# Patient Record
Sex: Female | Born: 1945
Health system: Southern US, Community
[De-identification: ages and names within clinical notes are randomized; demographics above are authoritative.]

## PROBLEM LIST (undated history)

## (undated) DIAGNOSIS — H269 Unspecified cataract: Secondary | ICD-10-CM

## (undated) DIAGNOSIS — K219 Gastro-esophageal reflux disease without esophagitis: Secondary | ICD-10-CM

## (undated) HISTORY — DX: Unspecified cataract: H26.9

## (undated) HISTORY — PX: ABDOMINAL HYSTERECTOMY: SHX81

## (undated) HISTORY — DX: Gastro-esophageal reflux disease without esophagitis: K21.9

---

## 2000-01-23 ENCOUNTER — Encounter: Admission: RE | Admit: 2000-01-23 | Discharge: 2000-01-23 | Payer: Self-pay | Admitting: Obstetrics and Gynecology

## 2000-01-23 ENCOUNTER — Encounter: Payer: Self-pay | Admitting: Obstetrics and Gynecology

## 2002-08-04 ENCOUNTER — Encounter: Payer: Self-pay | Admitting: Family Medicine

## 2002-08-04 ENCOUNTER — Encounter: Admission: RE | Admit: 2002-08-04 | Discharge: 2002-08-04 | Payer: Self-pay | Admitting: Family Medicine

## 2004-03-28 ENCOUNTER — Encounter: Admission: RE | Admit: 2004-03-28 | Discharge: 2004-03-28 | Payer: Self-pay | Admitting: Family Medicine

## 2004-09-11 ENCOUNTER — Ambulatory Visit: Payer: Self-pay | Admitting: Family Medicine

## 2005-04-09 ENCOUNTER — Ambulatory Visit: Payer: Self-pay | Admitting: Family Medicine

## 2005-04-15 ENCOUNTER — Encounter: Admission: RE | Admit: 2005-04-15 | Discharge: 2005-04-15 | Payer: Self-pay | Admitting: Family Medicine

## 2005-04-21 ENCOUNTER — Encounter: Admission: RE | Admit: 2005-04-21 | Discharge: 2005-04-21 | Payer: Self-pay | Admitting: Family Medicine

## 2005-05-26 ENCOUNTER — Ambulatory Visit: Payer: Self-pay | Admitting: Internal Medicine

## 2005-06-10 ENCOUNTER — Ambulatory Visit: Payer: Self-pay | Admitting: Internal Medicine

## 2005-06-10 ENCOUNTER — Encounter (INDEPENDENT_AMBULATORY_CARE_PROVIDER_SITE_OTHER): Payer: Self-pay | Admitting: Specialist

## 2005-09-13 ENCOUNTER — Ambulatory Visit: Payer: Self-pay | Admitting: Family Medicine

## 2005-11-03 LAB — HM COLONOSCOPY

## 2006-05-07 ENCOUNTER — Ambulatory Visit: Payer: Self-pay | Admitting: Family Medicine

## 2006-05-12 ENCOUNTER — Ambulatory Visit: Payer: Self-pay | Admitting: Family Medicine

## 2006-05-19 ENCOUNTER — Ambulatory Visit: Payer: Self-pay | Admitting: Family Medicine

## 2006-07-17 ENCOUNTER — Ambulatory Visit: Payer: Self-pay | Admitting: Family Medicine

## 2006-08-19 ENCOUNTER — Ambulatory Visit: Payer: Self-pay | Admitting: Family Medicine

## 2007-06-18 ENCOUNTER — Ambulatory Visit: Payer: Self-pay | Admitting: Family Medicine

## 2007-06-18 LAB — CONVERTED CEMR LAB
Bilirubin Urine: NEGATIVE
Glucose, Urine, Semiquant: NEGATIVE
Nitrite: NEGATIVE
Urobilinogen, UA: 0.2
pH: 6.5

## 2007-06-21 LAB — CONVERTED CEMR LAB
AST: 19 units/L (ref 0–37)
Basophils Relative: 1.3 % — ABNORMAL HIGH (ref 0.0–1.0)
Bilirubin, Direct: 0.1 mg/dL (ref 0.0–0.3)
CO2: 28 meq/L (ref 19–32)
Calcium: 8.7 mg/dL (ref 8.4–10.5)
Cholesterol: 166 mg/dL (ref 0–200)
Eosinophils Absolute: 0.1 10*3/uL (ref 0.0–0.6)
Eosinophils Relative: 2.8 % (ref 0.0–5.0)
GFR calc Af Amer: 82 mL/min
HCT: 38.1 % (ref 36.0–46.0)
LDL Cholesterol: 98 mg/dL (ref 0–99)
MCHC: 34.2 g/dL (ref 30.0–36.0)
MCV: 84.9 fL (ref 78.0–100.0)
Monocytes Relative: 11.4 % — ABNORMAL HIGH (ref 3.0–11.0)
Neutro Abs: 2.7 10*3/uL (ref 1.4–7.7)
Neutrophils Relative %: 55.9 % (ref 43.0–77.0)
Platelets: 226 10*3/uL (ref 150–400)
RDW: 14.1 % (ref 11.5–14.6)

## 2007-06-25 ENCOUNTER — Ambulatory Visit: Payer: Self-pay | Admitting: Family Medicine

## 2007-06-25 DIAGNOSIS — K219 Gastro-esophageal reflux disease without esophagitis: Secondary | ICD-10-CM | POA: Insufficient documentation

## 2007-06-28 ENCOUNTER — Telehealth: Payer: Self-pay | Admitting: Family Medicine

## 2007-07-06 ENCOUNTER — Encounter: Payer: Self-pay | Admitting: Family Medicine

## 2007-08-17 ENCOUNTER — Ambulatory Visit: Payer: Self-pay | Admitting: Family Medicine

## 2007-08-23 ENCOUNTER — Telehealth: Payer: Self-pay | Admitting: Family Medicine

## 2007-08-23 ENCOUNTER — Ambulatory Visit: Payer: Self-pay | Admitting: Family Medicine

## 2007-12-07 ENCOUNTER — Telehealth: Payer: Self-pay | Admitting: Family Medicine

## 2008-04-14 ENCOUNTER — Ambulatory Visit: Payer: Self-pay | Admitting: Family Medicine

## 2008-04-14 DIAGNOSIS — M8580 Other specified disorders of bone density and structure, unspecified site: Secondary | ICD-10-CM | POA: Insufficient documentation

## 2008-06-20 ENCOUNTER — Ambulatory Visit: Payer: Self-pay | Admitting: Family Medicine

## 2008-06-22 LAB — CONVERTED CEMR LAB
ALT: 14 units/L (ref 0–35)
Albumin: 3.6 g/dL (ref 3.5–5.2)
Alkaline Phosphatase: 63 units/L (ref 39–117)
Bilirubin, Direct: 0.1 mg/dL (ref 0.0–0.3)
Creatinine, Ser: 0.9 mg/dL (ref 0.4–1.2)
GFR calc Af Amer: 82 mL/min
Glucose, Bld: 87 mg/dL (ref 70–99)
Sodium: 138 meq/L (ref 135–145)
Total Bilirubin: 1.1 mg/dL (ref 0.3–1.2)
Total CHOL/HDL Ratio: 4
Total Protein: 7.6 g/dL (ref 6.0–8.3)
VLDL: 15 mg/dL (ref 0–40)
Vit D, 1,25-Dihydroxy: 58 (ref 30–89)

## 2008-07-05 ENCOUNTER — Telehealth: Payer: Self-pay | Admitting: Family Medicine

## 2008-07-25 ENCOUNTER — Ambulatory Visit: Payer: Self-pay | Admitting: Family Medicine

## 2008-08-03 ENCOUNTER — Encounter: Admission: RE | Admit: 2008-08-03 | Discharge: 2008-08-03 | Payer: Self-pay | Admitting: Family Medicine

## 2008-08-08 ENCOUNTER — Encounter (INDEPENDENT_AMBULATORY_CARE_PROVIDER_SITE_OTHER): Payer: Self-pay | Admitting: *Deleted

## 2008-08-24 ENCOUNTER — Ambulatory Visit: Payer: Self-pay | Admitting: Family Medicine

## 2008-08-24 DIAGNOSIS — I10 Essential (primary) hypertension: Secondary | ICD-10-CM | POA: Insufficient documentation

## 2008-12-01 ENCOUNTER — Ambulatory Visit: Payer: Self-pay | Admitting: Family Medicine

## 2009-07-26 ENCOUNTER — Ambulatory Visit: Payer: Self-pay | Admitting: Family Medicine

## 2009-07-27 LAB — CONVERTED CEMR LAB
ALT: 16 units/L (ref 0–35)
AST: 20 units/L (ref 0–37)
Albumin: 3.6 g/dL (ref 3.5–5.2)
Alkaline Phosphatase: 59 units/L (ref 39–117)
Calcium: 9.1 mg/dL (ref 8.4–10.5)
Cholesterol: 162 mg/dL (ref 0–200)
Creatinine, Ser: 0.9 mg/dL (ref 0.4–1.2)
GFR calc non Af Amer: 67.1 mL/min (ref >60)
Glucose, Bld: 92 mg/dL (ref 70–99)
Potassium: 4.8 meq/L (ref 3.5–5.1)
Sodium: 143 meq/L (ref 135–145)
Total CHOL/HDL Ratio: 4

## 2009-08-02 ENCOUNTER — Ambulatory Visit: Payer: Self-pay | Admitting: Family Medicine

## 2009-08-06 ENCOUNTER — Ambulatory Visit: Payer: Self-pay | Admitting: Internal Medicine

## 2009-08-06 ENCOUNTER — Encounter: Payer: Self-pay | Admitting: Family Medicine

## 2009-08-07 ENCOUNTER — Encounter: Admission: RE | Admit: 2009-08-07 | Discharge: 2009-08-07 | Payer: Self-pay | Admitting: Family Medicine

## 2009-08-09 DIAGNOSIS — N63 Unspecified lump in unspecified breast: Secondary | ICD-10-CM | POA: Insufficient documentation

## 2009-08-10 ENCOUNTER — Encounter: Payer: Self-pay | Admitting: Family Medicine

## 2009-08-14 ENCOUNTER — Encounter: Admission: RE | Admit: 2009-08-14 | Discharge: 2009-08-14 | Payer: Self-pay | Admitting: Family Medicine

## 2009-08-14 LAB — HM MAMMOGRAPHY

## 2010-07-15 ENCOUNTER — Ambulatory Visit: Payer: Self-pay | Admitting: Internal Medicine

## 2010-07-15 DIAGNOSIS — S93409A Sprain of unspecified ligament of unspecified ankle, initial encounter: Secondary | ICD-10-CM | POA: Insufficient documentation

## 2010-07-15 DIAGNOSIS — IMO0002 Reserved for concepts with insufficient information to code with codable children: Secondary | ICD-10-CM | POA: Insufficient documentation

## 2010-07-29 ENCOUNTER — Ambulatory Visit: Payer: Self-pay | Admitting: Internal Medicine

## 2010-12-01 LAB — CONVERTED CEMR LAB
Basophils Absolute: 0 10*3/uL (ref 0.0–0.1)
Eosinophils Relative: 1.5 % (ref 0.0–5.0)
Hemoglobin: 14 g/dL (ref 12.0–15.0)
MCHC: 34 g/dL (ref 30.0–36.0)
Monocytes Absolute: 0.8 10*3/uL (ref 0.1–1.0)
RBC: 4.59 M/uL (ref 3.87–5.11)
RDW: 13.4 % (ref 11.5–14.6)
WBC: 7 10*3/uL (ref 4.5–10.5)

## 2010-12-05 NOTE — Assessment & Plan Note (Signed)
Summary: fell on ankle 15 min /dlo   Vital Signs:  Patient profile:   65 year old female Height:      62 inches Weight:      183 pounds BMI:     33.59 Temp:     98.1 degrees F oral Pulse rate:   86 / minute Pulse rhythm:   regular BP sitting:   120 / 76  (left arm) Cuff size:   large  Vitals Entered By: Selena Batten Dance CMA Duncan Dull) (July 15, 2010 9:34 AM) CC: Left ankle swollen and bruised from fall 2 days ago Comments Patient has been icing and elevating over the weekend   History of Present Illness: CC: fell on knee and hurt ankle  2d ago in [place] fell onto R knee and possibly twisted L ankle.  Missed step.  No injuries to knee or ankle in past.  Opened skin on knee.  Has been icing ankle.  Hurts lateral ankle L.  Able to bear weight on ankle.  using ibuprofen which does help some.  neosporin on knee.  Current Medications (verified): 1)  Premarin 0.3 Mg  Tabs (Estrogens Conjugated) .... Take 1 Tablet By Mouth Once A Day 2)  Nexium 40 Mg Cpdr (Esomeprazole Magnesium) .Marland Kitchen.. 1 Tab By Mouth Daily 3)  Motrin Ib 200 Mg  Tabs (Ibuprofen) .... As Needed 4)  Multivitamins   Tabs (Multiple Vitamin) .... Take 1 Tablet By Mouth Once A Day 5)  Calcium Carbonate-Vitamin D 600-400 Mg-Unit  Tabs (Calcium Carbonate-Vitamin D) .... Take 1 Tablet By Mouth Once A Day  Allergies (verified): No Known Drug Allergies PMH-FH-SH reviewed for relevance  Review of Systems       per HPI  Physical Exam  General:  Well-developed,well-nourished,in no acute distress; alert,appropriate and cooperative throughout examination Msk:  FROM at knees, no joint tenderness bilaterally  Left ankle with swelling lateral malleolus, 1+ pitting edema to mid calf.  + tender to palpation at AFL, no point tenderness to medial/lateral malleolus.  No pain at base of 5th MT or naviculate.  Pt able to bear weight.  Negative squeeze test.  No significant ligamentous laxity.  R ankle WNL Pulses:  2+ PT and DP  bilaterally Neurologic:  sensation intact Skin:  + abrasion below R patella   Impression & Recommendations:  Problem # 1:  ANKLE SPRAIN, LEFT (ICD-845.00) ATF ligament sprain.  ASO brace, elevate the affected area, apply ICE for 20 minutes every hour while awake for next 2 days, and rest. Start physical therapy as directed and recheck in 10-14 days if no improvement, sooner if worse.  handout on ankle sprains provided, rec exercises discussed.  no need for xray.  Her updated medication list for this problem includes:    Motrin Ib 200 Mg Tabs (Ibuprofen) .Marland Kitchen... As needed  Problem # 2:  ABRASION, KNEE, RIGHT (ICD-916.0) continue abx ointment and changing dressing daily.  expect to heal well, discussed signs of infection to watch for  Complete Medication List: 1)  Premarin 0.3 Mg Tabs (Estrogens conjugated) .... Take 1 tablet by mouth once a day 2)  Nexium 40 Mg Cpdr (Esomeprazole magnesium) .Marland Kitchen.. 1 tab by mouth daily 3)  Motrin Ib 200 Mg Tabs (Ibuprofen) .... As needed 4)  Multivitamins Tabs (Multiple vitamin) .... Take 1 tablet by mouth once a day 5)  Calcium Carbonate-vitamin D 600-400 Mg-unit Tabs (Calcium carbonate-vitamin d) .... Take 1 tablet by mouth once a day  Other Orders: Flu Vaccine 40yrs + (50093) Admin 1st  Vaccine ([id number])  Patient Instructions: 1)  You have an ankle sprain of your left outer ankle.  Treat with ASO brace and anti inflammatory as needed, also continue to ice area as well as elevate leg and rest. 2)  Return in 2 weeks for follow up and to see how you're doing with the exercises provided, sooner if not improving as expected or worsening. 3)  Pleasure to meet you today. Call clinic with questions.  Current Allergies (reviewed today): No known allergies    Immunizations Administered:  Influenza Vaccine # 1:    Vaccine Type: Fluvax 3+    Site: right deltoid    Mfr: GlaxoSmithKline    Dose: 0.5 ml    Route: IM    Given by: Selena Batten Dance CMA (AAMA)    Exp.  Date: 05/03/2011    Lot #: [id number]    VIS given: 05/28/10 version given July 15, 2010.  Flu Vaccine Consent Questions:    Do you have a history of severe allergic reactions to this vaccine? no    Any prior history of allergic reactions to egg and/or gelatin? no    Do you have a sensitivity to the preservative Thimersol? no    Do you have a past history of Guillan-Barre Syndrome? no    Do you currently have an acute febrile illness? no    Have you ever had a severe reaction to latex? no    Vaccine information given and explained to patient? yes    Are you currently pregnant? no   Prevention & Chronic Care Immunizations   Influenza vaccine: Fluvax 3+  (07/15/2010)   Influenza vaccine due: 07/05/2011    Tetanus booster: 04/03/2000: historical   Tetanus booster due: 04/03/2010    Pneumococcal vaccine: Not documented    H. zoster vaccine: Not documented  Colorectal Screening   Hemoccult: Not documented   Hemoccult due: Not Indicated    Colonoscopy: Normal, q 10 years  (11/03/2005)   Colonoscopy due: 11/04/2015  Other Screening   Pap smear: Not documented   Pap smear due: Not Indicated    Mammogram: BI-RADS CATEGORY 2:  Benign finding(s).^MM DIGITAL DIAG LTD L  (08/14/2009)   Mammogram due: 08/14/2010    DXA bone density scan: osteopenia  (07/05/2007)   DXA scan due: 07/2009    Smoking status: never  (04/14/2008)  Lipids   Total Cholesterol: 162  (07/26/2009)   LDL: 109  (07/26/2009)   LDL Direct: Not documented   HDL: 44.90  (07/26/2009)   Triglycerides: 41.0  (07/26/2009)  Hypertension   Last Blood Pressure: 120 / 76  (07/15/2010)   Serum creatinine: 0.9  (07/26/2009)   Serum potassium 4.8  (07/26/2009)  Self-Management Support :    Hypertension self-management support: Not documented

## 2010-12-05 NOTE — Assessment & Plan Note (Signed)
Summary: 2 WEEK FOLLOWUP/[facility]   Vital Signs:  Patient profile:   65 year old female Weight:      182.50 pounds Temp:     98.5 degrees F oral Pulse rate:   76 / minute Pulse rhythm:   regular BP sitting:   110 / 72  (left arm) Cuff size:   large  Vitals Entered By: Selena Batten Dance CMA Duncan Dull) (July 29, 2010 3:13 PM) CC: 2 week follow up   History of Present Illness: CC:   DOI: 07/13/10  Fell in [place], evaluated in clinic 2 days later, thought injured L ankle (ATF ligament sprain).  Treated with ice, NSAIDs, ASO brace.  Not really using ASO brace anymore.  pain completely gone, still with some swelling worse during day while on feet.  Allergies (verified): No Known Drug Allergies  Physical Exam  General:  Well-developed,well-nourished,in no acute distress; alert,appropriate and cooperative throughout examination Msk:  Left ankle with swelling lateral and medial malleolus, improvement in edema but still mild.  No tenderness to palpation.  No significant ligamentous laxity.  R ankle WNL Pulses:  2+ PT and DP bilaterally Neurologic:  sensation intact   Impression & Recommendations:  Problem # 1:  ANKLE SPRAIN, LEFT (ICD-845.00) Assessment Improved improved.  continue strengthening/stretching exercises Her updated medication list for this problem includes:    Motrin Ib 200 Mg Tabs (Ibuprofen) .Marland Kitchen... As needed  Problem # 2:  ABRASION, KNEE, RIGHT (ICD-916.0) Assessment: Improved resolved  Complete Medication List: 1)  Premarin 0.3 Mg Tabs (Estrogens conjugated) .... Take 1 tablet by mouth once a day 2)  Nexium 40 Mg Cpdr (Esomeprazole magnesium) .Marland Kitchen.. 1 tab by mouth daily 3)  Motrin Ib 200 Mg Tabs (Ibuprofen) .... As needed 4)  Multivitamins Tabs (Multiple vitamin) .... Take 1 tablet by mouth once a day 5)  Calcium Carbonate-vitamin D 600-400 Mg-unit Tabs (Calcium carbonate-vitamin d) .... Take 1 tablet by mouth once a day  Patient Instructions: 1)  ASO brace as needed  for swelling. 2)  Continue stretching and strengthening exercises as discussed (alphabet, lifting book with toes) 3)  I'm glad you're feeling better. 4)  Good to see you today.  Call clinic with questions.  Current Allergies (reviewed today): No known allergies

## 2011-02-03 ENCOUNTER — Other Ambulatory Visit: Payer: Self-pay | Admitting: Family Medicine

## 2011-03-21 NOTE — Assessment & Plan Note (Signed)
[facility]                              [facility] OFFICE NOTE   NAME:JOBEMarshia, Tropea                        MRN:          [id number]  DATE:05/19/2006                            DOB:          03-Jul-1946    This is a 65 year old woman here for complete physical examination.  She has  a couple things to discuss.  First off, she would like to have her Premarin  refilled.  A year ago we switched from every other day dosing to every day  dosing with low-dose Premarin.  She is satisfied with this and has good  control of her hot flashes.  Also, last year we discussed problems with acid  reflux.  We attempted to put her on Nexium daily.  Her insurance would not  cover this so we tried generic omeprazole instead.  She says this does not  help at all and has tried to use over-the-counter measures.  Also, about two  weeks ago an itchy red rash came up on the backs of both of her heels.  She  has never had this problem before.  Otherwise, after we saw her a year ago  she had a colonoscopy in August of 2006.  This was normal except for some  mild diverticulosis.  Dr. Juanda Chance recommended a follow-up colonoscopy in 10  years.  The patient does set up her own yearly mammograms and plans to set  up another one for this year some time soon.  For other details of her past  medical history, family history, social history, habits, etc. see last  physical note dated April 09, 2005.   ALLERGIES:  None.   CURRENT MEDICATIONS:  1.  Premarin 0.3 mg once a day.  2.  Prilosec OTC as needed.   OBJECTIVE:  VITAL SIGNS:  Height 5 feet 3 inches, weight 166, blood pressure  126/80, pulse 72 and regular.  GENERAL:  She appears to be doing well.  SKIN:  Remarkable only for a red, scaly rash on both posterior heels.  HEENT:  Eyes are clear.  Sclerae, pharynx clear.  NECK:  Supple without lymphadenopathy, masses.  LUNGS:  Clear.  CARDIAC:  Rate and rhythm regular without gallops,  murmurs, rubs.  Pulses  are full.  EKG is within normal limits.  BREASTS:  Clear.  AXILLAE:  Clear.  ABDOMEN:  Soft.  Normal bowel sounds.  Nontender.  No masses.  PELVIC:  External genitalia are unremarkable.  RECTAL:  No mass or tenderness.  Stool hemoccult-negative.  EXTREMITIES:  No clubbing, cyanosis, edema.  NEUROLOGIC:  Grossly intact.   She was here for fasting laboratories on July 10.  These were all within  normal limits.   ASSESSMENT AND PLAN:  1.  Complete physical.  I reminded her to set up her mammogram some time      soon and encouraged her to get more regular exercise.  2.  Hormone replacement therapy.  I refilled Premarin as above for the      coming year.  3.  Gastroesophageal reflux disease.  Prevacid 30 mg once  a day.  4.  Eczema.  Triamcinolone 0.1% cream b.i.d. as needed.                                   Tera Mater. Clent Ridges, MD   SAF/MedQ  DD:  05/19/2006  DT:  05/19/2006  Job #:  [id number]

## 2011-04-04 ENCOUNTER — Other Ambulatory Visit: Payer: Self-pay | Admitting: Family Medicine

## 2011-06-04 ENCOUNTER — Other Ambulatory Visit: Payer: Self-pay | Admitting: Family Medicine

## 2011-08-10 ENCOUNTER — Other Ambulatory Visit: Payer: Self-pay | Admitting: Family Medicine

## 2011-08-11 NOTE — Telephone Encounter (Signed)
Overdue for CPX.Molli Knock to refill to appt once it is made.

## 2011-08-11 NOTE — Telephone Encounter (Signed)
Left message for patient to return call.

## 2011-08-12 NOTE — Telephone Encounter (Signed)
Left message on machine for patient to call back. Pharmacist notified that patient needs to call the office to schedule a CPX before refills can be approved.

## 2011-08-13 ENCOUNTER — Other Ambulatory Visit: Payer: Self-pay | Admitting: *Deleted

## 2011-08-13 MED ORDER — ESTROGENS CONJUGATED 0.3 MG PO TABS: ORAL_TABLET | ORAL | Status: DC

## 2011-08-13 NOTE — Telephone Encounter (Signed)
Patient schedule appt for cpx on oct 25

## 2011-08-27 ENCOUNTER — Encounter: Payer: Self-pay | Admitting: Family Medicine

## 2011-08-28 ENCOUNTER — Ambulatory Visit: Payer: Medicare Other

## 2011-08-28 ENCOUNTER — Ambulatory Visit (INDEPENDENT_AMBULATORY_CARE_PROVIDER_SITE_OTHER): Payer: Medicare Other | Admitting: Family Medicine

## 2011-08-28 ENCOUNTER — Encounter: Payer: Self-pay | Admitting: Family Medicine

## 2011-08-28 VITALS — BP 148/80 | HR 80 | Temp 98.1°F | Ht 62.0 in | Wt 180.5 lb

## 2011-08-28 DIAGNOSIS — Z23 Encounter for immunization: Secondary | ICD-10-CM

## 2011-08-28 DIAGNOSIS — Z0000 Encounter for general adult medical examination without abnormal findings: Secondary | ICD-10-CM

## 2011-08-28 DIAGNOSIS — K219 Gastro-esophageal reflux disease without esophagitis: Secondary | ICD-10-CM

## 2011-08-28 DIAGNOSIS — Z780 Asymptomatic menopausal state: Secondary | ICD-10-CM

## 2011-08-28 DIAGNOSIS — M899 Disorder of bone, unspecified: Secondary | ICD-10-CM

## 2011-08-28 DIAGNOSIS — Z1231 Encounter for screening mammogram for malignant neoplasm of breast: Secondary | ICD-10-CM

## 2011-08-28 DIAGNOSIS — I10 Essential (primary) hypertension: Secondary | ICD-10-CM

## 2011-08-28 DIAGNOSIS — M949 Disorder of cartilage, unspecified: Secondary | ICD-10-CM

## 2011-08-28 LAB — CBC WITH DIFFERENTIAL/PLATELET
Basophils Absolute: 0 10*3/uL (ref 0.0–0.1)
Basophils Relative: 0.8 % (ref 0.0–3.0)
Eosinophils Absolute: 0.1 10*3/uL (ref 0.0–0.7)
Eosinophils Relative: 1.3 % (ref 0.0–5.0)
Neutro Abs: 3.8 10*3/uL (ref 1.4–7.7)
Neutrophils Relative %: 62.2 % (ref 43.0–77.0)
WBC: 6.1 10*3/uL (ref 4.5–10.5)

## 2011-08-28 LAB — COMPREHENSIVE METABOLIC PANEL
Albumin: 4.1 g/dL (ref 3.5–5.2)
CO2: 27 mEq/L (ref 19–32)
Chloride: 107 mEq/L (ref 96–112)
Creatinine, Ser: 1 mg/dL (ref 0.4–1.2)
Potassium: 4.7 mEq/L (ref 3.5–5.1)
Sodium: 143 mEq/L (ref 135–145)
Total Protein: 7.8 g/dL (ref 6.0–8.3)

## 2011-08-28 LAB — TSH: TSH: 1.21 u[IU]/mL (ref 0.35–5.50)

## 2011-08-28 LAB — LIPID PANEL
Cholesterol: 162 mg/dL (ref 0–200)
HDL: 48.7 mg/dL (ref >39.00)
VLDL: 21.2 mg/dL (ref 0.0–40.0)

## 2011-08-28 MED ORDER — ESOMEPRAZOLE MAGNESIUM 40 MG PO CPDR: 40.0000 mg | DELAYED_RELEASE_CAPSULE | Freq: Every day | ORAL | Status: DC

## 2011-08-28 MED ORDER — ESTROGENS CONJUGATED 0.3 MG PO TABS: 0.3000 mg | ORAL_TABLET | Freq: Every day | ORAL | Status: DC

## 2011-08-28 NOTE — Patient Instructions (Addendum)
Over the next week or 2 .Marland Kitchen Check BPs at home different times of the day.. Call with results. Goal  <140/90. Work on regular exercise, healthy eating, low fat, low carb diet. Speak with Shirlee Limerick on way out after labs about referrals. Look into insurance about shingles vaccine coverage. Try to wean off premarin as able, over a period 1-2 months.

## 2011-08-28 NOTE — Assessment & Plan Note (Signed)
Due for re-eval. 

## 2011-08-28 NOTE — Assessment & Plan Note (Signed)
Borderline control.. Follow at home.. If greater than 140/90 consistently consider HCTZ.  Will check for secondary cause and end organ damage with labs and EKG.

## 2011-08-28 NOTE — Assessment & Plan Note (Signed)
Stable

## 2011-08-28 NOTE — Progress Notes (Signed)
Subjective:    Patient ID: Danielle Kane, female    DOB: [birth year], 65 y.o.   MRN: [id number]  HPI I have personally reviewed the Medicare Annual Wellness questionnaire and have noted 1. The patient's medical and social history 2. Their use of alcohol, tobacco or illicit drugs 3. Their current medications and supplements 4. The patient's functional ability including ADL's, fall risks, home safety risks and hearing or visual             impairment. 5. Diet and physical activities 6. Evidence for depression or mood disorders  The patients weight, height, BMI and visual acuity have been recorded in the chart I have made referrals, counseling and provided education to the patient based review of the above and I have provided the pt with a written personalized care plan for preventive services.  Hypertension:   BP elevated today  on no medication.  (last 2 OV in 07/2010 BP nml in office)  Chest pain with exertion: None Edema: occ after being on feet long day Short of breath:None Average home BPs: not checking recently. Other issues: walking rarely for exercsie. Due for fasting labs   GERD: on nexium 40 mg daily   Review of Systems  Constitutional: Negative for fever, fatigue and unexpected weight change.  HENT: Negative for ear pain, congestion, sore throat, sneezing, trouble swallowing and sinus pressure.   Eyes: Negative for pain and itching.  Respiratory: Negative for cough, shortness of breath and wheezing.   Cardiovascular: Negative for chest pain, palpitations and leg swelling.  Gastrointestinal: Negative for nausea, abdominal pain, diarrhea, constipation and blood in stool.  Genitourinary: Negative for dysuria, hematuria, vaginal discharge, difficulty urinating and menstrual problem.  Skin: Negative for rash.  Neurological: Negative for syncope, weakness, light-headedness, numbness and headaches.  Psychiatric/Behavioral: Negative for confusion and dysphoric mood. The patient  is not nervous/anxious.        Objective:   Physical Exam  Constitutional: Vital signs are normal. She appears well-developed and well-nourished. She is cooperative.  Non-toxic appearance. She does not appear ill. No distress.       overweight  HENT:  Head: Normocephalic.  Right Ear: Hearing, tympanic membrane, external ear and ear canal normal.  Left Ear: Hearing, tympanic membrane, external ear and ear canal normal.  Nose: Nose normal.  Eyes: Conjunctivae, EOM and lids are normal. Pupils are equal, round, and reactive to light. No foreign bodies found.  Neck: Trachea normal and normal range of motion. Neck supple. Carotid bruit is not present. No mass and no thyromegaly present.  Cardiovascular: Normal rate, regular rhythm, S1 normal, S2 normal, normal heart sounds and intact distal pulses.  Exam reveals no gallop.   No murmur heard. Pulmonary/Chest: Effort normal and breath sounds normal. No respiratory distress. She has no wheezes. She has no rhonchi. She has no rales.  Abdominal: Soft. Normal appearance and bowel sounds are normal. She exhibits no distension, no fluid wave, no abdominal bruit and no mass. There is no hepatosplenomegaly. There is no tenderness. There is no rebound, no guarding and no CVA tenderness. No hernia.  Genitourinary: No breast swelling, tenderness, discharge or bleeding. Pelvic exam was performed with patient prone.  Lymphadenopathy:    She has no cervical adenopathy.    She has no axillary adenopathy.  Neurological: She is alert. She has normal strength. No cranial nerve deficit or sensory deficit.  Skin: Skin is warm, dry and intact. No rash noted.  Psychiatric: Her speech is normal and behavior is  normal. Judgment normal. Her mood appears not anxious. Cognition and memory are normal. She does not exhibit a depressed mood.          Assessment & Plan:   Welcome to Medicare: The patient's preventative maintenance and recommended screening tests for an  annual wellness exam were reviewed in full today. Brought up to date unless services declined.  Counselled on the importance of diet, exercise, and its role in overall health and mortality. The patient's FH and SH was reviewed, including their home life, tobacco status, and drug and alcohol status.   EKG today performed:  Last DEXA 2010, due for repeat, osteopenia. Mammogram due, will schedule. Colonoscopy 2007, rpt in 10 years. Vaccines: flu given, due for zostavax, PNA ....  Given PNA today. She will look into shingles coverage. Will get tdap at pharmacy given not covered by Medicare.  DVE/pap: not indicated.. S/p hysterectomy

## 2011-09-04 ENCOUNTER — Encounter: Payer: Self-pay | Admitting: *Deleted

## 2011-09-05 ENCOUNTER — Other Ambulatory Visit: Payer: Medicare Other

## 2011-09-19 ENCOUNTER — Ambulatory Visit
Admission: RE | Admit: 2011-09-19 | Discharge: 2011-09-19 | Disposition: A | Discharge status: Home / Self Care | Payer: Medicare Other | Source: Ambulatory Visit | Attending: Family Medicine | Admitting: Family Medicine

## 2011-09-19 DIAGNOSIS — Z780 Asymptomatic menopausal state: Secondary | ICD-10-CM

## 2011-09-19 DIAGNOSIS — Z1231 Encounter for screening mammogram for malignant neoplasm of breast: Secondary | ICD-10-CM

## 2012-08-26 ENCOUNTER — Ambulatory Visit (INDEPENDENT_AMBULATORY_CARE_PROVIDER_SITE_OTHER): Payer: Medicare Other

## 2012-08-26 DIAGNOSIS — Z23 Encounter for immunization: Secondary | ICD-10-CM

## 2012-09-03 ENCOUNTER — Other Ambulatory Visit: Payer: Self-pay | Admitting: Family Medicine

## 2012-10-06 ENCOUNTER — Other Ambulatory Visit: Payer: Self-pay | Admitting: Family Medicine

## 2012-12-07 ENCOUNTER — Telehealth: Payer: Self-pay

## 2012-12-07 NOTE — Telephone Encounter (Signed)
[facility] faxed prior auth premarin; spoke with Trula Ore [phone] and approved over phone until 11/02/13. [facility] notified and rx went thru.

## 2012-12-12 ENCOUNTER — Telehealth: Payer: Self-pay | Admitting: Family Medicine

## 2012-12-12 DIAGNOSIS — M949 Disorder of cartilage, unspecified: Secondary | ICD-10-CM

## 2012-12-12 DIAGNOSIS — M899 Disorder of bone, unspecified: Secondary | ICD-10-CM

## 2012-12-12 DIAGNOSIS — I10 Essential (primary) hypertension: Secondary | ICD-10-CM

## 2012-12-12 NOTE — Telephone Encounter (Signed)
Message copied by Excell Seltzer on Sun Dec 12, 2012 10:50 PM ------      Message from: [name]      Created: Mon Dec 06, 2012  4:06 PM      Regarding: Lab orders for Tuesday, 2.11.14       Patient is scheduled for CPX labs, please order future labs, Thanks , Danielle Kane       ------

## 2012-12-14 ENCOUNTER — Other Ambulatory Visit (INDEPENDENT_AMBULATORY_CARE_PROVIDER_SITE_OTHER): Payer: Medicare Other

## 2012-12-14 DIAGNOSIS — M949 Disorder of cartilage, unspecified: Secondary | ICD-10-CM

## 2012-12-14 DIAGNOSIS — I10 Essential (primary) hypertension: Secondary | ICD-10-CM

## 2012-12-14 DIAGNOSIS — M899 Disorder of bone, unspecified: Secondary | ICD-10-CM

## 2012-12-14 LAB — COMPREHENSIVE METABOLIC PANEL
ALT: 16 U/L (ref 0–35)
Albumin: 3.7 g/dL (ref 3.5–5.2)
Alkaline Phosphatase: 54 U/L (ref 39–117)
CO2: 27 mEq/L (ref 19–32)
Glucose, Bld: 87 mg/dL (ref 70–99)
Potassium: 4.2 mEq/L (ref 3.5–5.1)
Sodium: 139 mEq/L (ref 135–145)
Total Bilirubin: 0.9 mg/dL (ref 0.3–1.2)
Total Protein: 7.3 g/dL (ref 6.0–8.3)

## 2012-12-14 LAB — LIPID PANEL
HDL: 43.1 mg/dL (ref >39.00)
Total CHOL/HDL Ratio: 4
VLDL: 16.8 mg/dL (ref 0.0–40.0)

## 2012-12-15 LAB — VITAMIN D 25 HYDROXY (VIT D DEFICIENCY, FRACTURES): Vit D, 25-Hydroxy: 38 ng/mL (ref 30–89)

## 2012-12-21 ENCOUNTER — Ambulatory Visit (INDEPENDENT_AMBULATORY_CARE_PROVIDER_SITE_OTHER): Payer: Medicare Other | Admitting: Family Medicine

## 2012-12-21 ENCOUNTER — Encounter: Payer: Self-pay | Admitting: Family Medicine

## 2012-12-21 VITALS — BP 120/70 | HR 80 | Temp 98.1°F | Ht 62.0 in | Wt 187.5 lb

## 2012-12-21 DIAGNOSIS — M899 Disorder of bone, unspecified: Secondary | ICD-10-CM

## 2012-12-21 DIAGNOSIS — Z0000 Encounter for general adult medical examination without abnormal findings: Secondary | ICD-10-CM

## 2012-12-21 DIAGNOSIS — I10 Essential (primary) hypertension: Secondary | ICD-10-CM

## 2012-12-21 MED ORDER — ESTROGENS CONJUGATED 0.3 MG PO TABS: 0.3000 mg | ORAL_TABLET | ORAL | Status: DC

## 2012-12-21 MED ORDER — ESOMEPRAZOLE MAGNESIUM 40 MG PO CPDR: DELAYED_RELEASE_CAPSULE | ORAL | Status: DC

## 2012-12-21 NOTE — Assessment & Plan Note (Signed)
Stble last check... Repeat DEXA in 2016

## 2012-12-21 NOTE — Patient Instructions (Addendum)
Increase exercise, continue healthy eating, work on weight loss.  Schedule mammogram on your own.

## 2012-12-21 NOTE — Assessment & Plan Note (Signed)
Well controlled on no medication  

## 2012-12-21 NOTE — Progress Notes (Signed)
HPI  I have personally reviewed the Medicare Annual Wellness questionnaire and have noted  1. The patient's medical and social history  2. Their use of alcohol, tobacco or illicit drugs  3. Their current medications and supplements  4. The patient's functional ability including ADL's, fall risks, home safety risks and hearing or visual  impairment.  5. Diet and physical activities  6. Evidence for depression or mood disorders  The patients weight, height, BMI and visual acuity have been recorded in the chart  I have made referrals, counseling and provided education to the patient based review of the above and I have provided the pt with a written personalized care plan for preventive services.    Occ Bilateral pain in CMC joints and pain radiates up arms. No numbness and tingling.  Ibuprofen does not help.  Types a lot at work.  Hypertension: BP good today on no medication.  Chest pain with exertion: None  Edema: occ after being on feet long day  Short of breath:None  Average home BPs: not checking  Other issues: walking rarely for exercise, diet good control.  Lab Results  Component Value Date   CHOL 151 12/14/2012   HDL 43.10 12/14/2012   LDLCALC 91 12/14/2012   TRIG 84.0 12/14/2012   CHOLHDL 4 12/14/2012     GERD: on nexium 40 mg daily   Review of Systems  Constitutional: Negative for fever, fatigue and unexpected weight change.  HENT: Negative for ear pain, congestion, sore throat, sneezing, trouble swallowing and sinus pressure.  Eyes: Negative for pain and itching.  Respiratory: Negative for cough, shortness of breath and wheezing.  Cardiovascular: Negative for chest pain, palpitations and leg swelling.  Gastrointestinal: Negative for nausea, abdominal pain, diarrhea, constipation and blood in stool.  Genitourinary: Negative for dysuria, hematuria, vaginal discharge, difficulty urinating and menstrual problem.  Skin: Negative for rash.  Neurological: Negative for  syncope, weakness, light-headedness, numbness and headaches.  Psychiatric/Behavioral: Negative for confusion and dysphoric mood. The patient is not nervous/anxious.  Objective:   Physical Exam  Constitutional: Vital signs are normal. She appears well-developed and well-nourished. She is cooperative. Non-toxic appearance. She does not appear ill. No distress.  overweight  HENT:  Head: Normocephalic.  Right Ear: Hearing, tympanic membrane, external ear and ear canal normal.  Left Ear: Hearing, tympanic membrane, external ear and ear canal normal.  Nose: Nose normal.  Eyes: Conjunctivae, EOM and lids are normal. Pupils are equal, round, and reactive to light. No foreign bodies found.  Neck: Trachea normal and normal range of motion. Neck supple. Carotid bruit is not present. No mass and no thyromegaly present.  Cardiovascular: Normal rate, regular rhythm, S1 normal, S2 normal, normal heart sounds and intact distal pulses. Exam reveals no gallop.  No murmur heard.  Pulmonary/Chest: Effort normal and breath sounds normal. No respiratory distress. She has no wheezes. She has no rhonchi. She has no rales.  Abdominal: Soft. Normal appearance and bowel sounds are normal. She exhibits no distension, no fluid wave, no abdominal bruit and no mass. There is no hepatosplenomegaly. There is no tenderness. There is no rebound, no guarding and no CVA tenderness. No hernia.  Genitourinary: No breast swelling, tenderness, discharge or bleeding. Pelvic exam was performed with patient prone.  Lymphadenopathy:  She has no cervical adenopathy.  She has no axillary adenopathy.  Neurological: She is alert. She has normal strength. No cranial nerve deficit or sensory deficit.  Skin: Skin is warm, dry and intact. No rash noted.  Psychiatric: Her speech is normal and behavior is normal. Judgment normal. Her mood appears not anxious. Cognition and memory are normal. She does not exhibit a depressed mood.  Assessment &  Plan:   Welcome to Medicare:  The patient's preventative maintenance and recommended screening tests for an annual wellness exam were reviewed in full today.  Brought up to date unless services declined.  Counselled on the importance of diet, exercise, and its role in overall health and mortality.  The patient's FH and SH was reviewed, including their home life, tobacco status, and drug and alcohol status.  Last DEXA 2012, due for repeat in 5 years, osteopenia.  Mammogram due, will schedule on her own.  Colonoscopy 2007, rpt in 10 years, 2017 Vaccines: Uptodate with Tdap, flu, PNA , Zoster DVE/pap: Not indicated.. S/p hysterectomy.

## 2013-02-04 ENCOUNTER — Other Ambulatory Visit: Payer: Self-pay | Admitting: Family Medicine

## 2013-07-13 ENCOUNTER — Ambulatory Visit (INDEPENDENT_AMBULATORY_CARE_PROVIDER_SITE_OTHER): Payer: Medicare Other | Admitting: Internal Medicine

## 2013-07-13 ENCOUNTER — Encounter: Payer: Self-pay | Admitting: Internal Medicine

## 2013-07-13 VITALS — BP 130/70 | HR 78 | Temp 97.7°F | Wt 189.0 lb

## 2013-07-13 DIAGNOSIS — B029 Zoster without complications: Secondary | ICD-10-CM

## 2013-07-13 DIAGNOSIS — H109 Unspecified conjunctivitis: Secondary | ICD-10-CM

## 2013-07-13 MED ORDER — VALACYCLOVIR HCL 1 G PO TABS: 1000.0000 mg | ORAL_TABLET | Freq: Three times a day (TID) | ORAL | Status: DC

## 2013-07-13 MED ORDER — POLYMYXIN B-TRIMETHOPRIM 10000-0.1 UNIT/ML-% OP SOLN: 1.0000 [drp] | OPHTHALMIC | Status: DC

## 2013-07-13 NOTE — Progress Notes (Signed)
Subjective:    Patient ID: Danielle Kane, female    DOB: Aug 19, 1946, 67 y.o.   MRN: [id number]  HPI  Pt presents to the clinic today with c/o swelling underneath the left eye. This started on Monday. It started off with little bumps under her eye. Then she noticed the bottom eyelid starting to swell up. It has progressed to eye irritation and drainage. She denies fever, chills, difficulty with vision or body aches. She has not had any injury to the eye or face that she is aware of. She has never had a rash like this before.   Review of Systems      Past Medical History  Diagnosis Date  . GERD (gastroesophageal reflux disease)     Current Outpatient Prescriptions  Medication Sig Dispense Refill  . Calcium Carbonate-Vitamin D (CALCIUM 600-D) 600-400 MG-UNIT per tablet Take 1 tablet by mouth daily.        Marland Kitchen esomeprazole (NEXIUM) 40 MG capsule TAKE 1 CAPSULE BY MOUTH ONCE A DAY BEFORE BREAKFAST  30 capsule  11  . estrogens, conjugated, (PREMARIN) 0.3 MG tablet Take 1 tablet (0.3 mg total) by mouth every other day. Take daily for 21 days then do not take for 7 days.  30 tablet  11  . ibuprofen (ADVIL,MOTRIN) 200 MG tablet Take 200 mg by mouth every 6 (six) hours as needed.        . Multiple Vitamin (MULTIVITAMIN) tablet Take 1 tablet by mouth daily.         No current facility-administered medications for this visit.    No Known Allergies  Family History  Problem Relation Age of Onset  . Heart failure Mother   . Stroke Father   . Kidney disease Maternal Grandmother   . Coronary artery disease Maternal Grandfather   . Heart attack Maternal Grandfather   . Osteoporosis Paternal Grandfather     History   Social History  . Marital Status: Married    Spouse Name: N/A    Number of Children: N/A  . Years of Education: N/A   Occupational History  . manager    Social History Main Topics  . Smoking status: Never Smoker   . Smokeless tobacco: Not on file  . Alcohol Use: No  .  Drug Use: No  . Sexual Activity: Not on file   Other Topics Concern  . Not on file   Social History Narrative   Regular exercise-no   Diet fruits and veggies, water    Has living will, full code     Constitutional: Denies fever, malaise, fatigue, headache or abrupt weight changes.  HEENT: Pt reports eye redness. Denies eye pain, ear pain, ringing in the ears, wax buildup, runny nose, nasal congestion, bloody nose, or sore throat. Skin: Pt reports rash under left eye.  Neurological: Denies dizziness, difficulty with memory, difficulty with speech or problems with balance and coordination.   No other specific complaints in a complete review of systems (except as listed in HPI above).  Objective:   Physical Exam   BP 130/70  Pulse 78  Temp(Src) 97.7 F (36.5 C) (Tympanic)  Wt 189 lb (85.73 kg)  BMI 34.56 kg/m2  SpO2 99% Wt Readings from Last 3 Encounters:  07/13/13 189 lb (85.73 kg)  12/21/12 187 lb 8 oz (85.049 kg)  08/28/11 180 lb 8 oz (81.874 kg)    General: Appears her stated age, well developed, well nourished in NAD. Skin: Warm, dry and intact. Small vesicular lesions on  erythematous base, grouped. HEENT: Head: normal shape and size; Eyes: sclera injected, thick mucous noted at outer canthus, no icterus, conjunctiva pink, PERRLA and EOMs intact; Ears: Tm's gray and intact, normal light reflex; Nose: mucosa pink and moist, septum midline; Throat/Mouth: Teeth present, mucosa pink and moist, no exudate, lesions or ulcerations noted.  Cardiovascular: Normal rate and rhythm. S1,S2 noted.  No murmur, rubs or gallops noted. No JVD or BLE edema. No carotid bruits noted. Pulmonary/Chest: Normal effort and positive vesicular breath sounds. No respiratory distress. No wheezes, rales or ronchi noted.   BMET    Component Value Date/Time   NA 139 12/14/2012 0805   K 4.2 12/14/2012 0805   CL 107 12/14/2012 0805   CO2 27 12/14/2012 0805   GLUCOSE 87 12/14/2012 0805   BUN 17 12/14/2012  0805   CREATININE 0.9 12/14/2012 0805   CALCIUM 9.0 12/14/2012 0805   GFRNONAA 67.10 07/26/2009 0859   GFRAA 82 06/20/2008 1036    Lipid Panel     Component Value Date/Time   CHOL 151 12/14/2012 0805   TRIG 84.0 12/14/2012 0805   HDL 43.10 12/14/2012 0805   CHOLHDL 4 12/14/2012 0805   VLDL 16.8 12/14/2012 0805   LDLCALC 91 12/14/2012 0805    CBC    Component Value Date/Time   WBC 6.1 08/28/2011 1006   RBC 4.74 08/28/2011 1006   HGB 14.6 08/28/2011 1006   HCT 43.0 08/28/2011 1006   PLT 233.0 08/28/2011 1006   MCV 90.8 08/28/2011 1006   MCHC 33.8 08/28/2011 1006   RDW 13.8 08/28/2011 1006   LYMPHSABS 1.6 08/28/2011 1006   MONOABS 0.6 08/28/2011 1006   EOSABS 0.1 08/28/2011 1006   BASOSABS 0.0 08/28/2011 1006    Hgb A1C No results found for this basename: HGBA1C        Assessment & Plan:   Shingles underneath left eye, new onset:  eRx for Valtrex Pt has already had her shingles shot Make an eye appt ASAP  Conjunctivitis of left eye, new onset:  eRx for polytrim eye drops, every 4 hours as needed Warm compresses to eyes for comfort  RTC as needed or if symptoms persist or worsen

## 2013-07-13 NOTE — Patient Instructions (Signed)
Shingles Shingles (herpes zoster) is an infection that is caused by the same virus that causes chickenpox (varicella). The infection causes a painful skin rash and fluid-filled blisters, which eventually break open, crust over, and heal. It may occur in any area of the body, but it usually affects only one side of the body or face. The pain of shingles usually lasts about 1 month. However, some people with shingles may develop long-term (chronic) pain in the affected area of the body. Shingles often occurs many years after the person had chickenpox. It is more common:  In people older than 50 years.  In people with weakened immune systems, such as those with HIV, AIDS, or cancer.  In people taking medicines that weaken the immune system, such as transplant medicines.  In people under great stress. CAUSES  Shingles is caused by the varicella zoster virus (VZV), which also causes chickenpox. After a person is infected with the virus, it can remain in the person's body for years in an inactive state (dormant). To cause shingles, the virus reactivates and breaks out as an infection in a nerve root. The virus can be spread from person to person (contagious) through contact with open blisters of the shingles rash. It will only spread to people who have not had chickenpox. When these people are exposed to the virus, they may develop chickenpox. They will not develop shingles. Once the blisters scab over, the person is no longer contagious and cannot spread the virus to others. SYMPTOMS  Shingles shows up in stages. The initial symptoms may be pain, itching, and tingling in an area of the skin. This pain is usually described as burning, stabbing, or throbbing.In a few days or weeks, a painful red rash will appear in the area where the pain, itching, and tingling were felt. The rash is usually on one side of the body in a band or belt-like pattern. Then, the rash usually turns into fluid-filled blisters. They  will scab over and dry up in approximately 2 3 weeks. Flu-like symptoms may also occur with the initial symptoms, the rash, or the blisters. These may include:  Fever.  Chills.  Headache.  Upset stomach. DIAGNOSIS  Your caregiver will perform a skin exam to diagnose shingles. Skin scrapings or fluid samples may also be taken from the blisters. This sample will be examined under a microscope or sent to a lab for further testing. TREATMENT  There is no specific cure for shingles. Your caregiver will likely prescribe medicines to help you manage the pain, recover faster, and avoid long-term problems. This may include antiviral drugs, anti-inflammatory drugs, and pain medicines. HOME CARE INSTRUCTIONS   Take a cool bath or apply cool compresses to the area of the rash or blisters as directed. This may help with the pain and itching.   Only take over-the-counter or prescription medicines as directed by your caregiver.   Rest as directed by your caregiver.  Keep your rash and blisters clean with mild soap and cool water or as directed by your caregiver.  Do not pick your blisters or scratch your rash. Apply an anti-itch cream or numbing creams to the affected area as directed by your caregiver.  Keep your shingles rash covered with a loose bandage (dressing).  Avoid skin contact with:  Babies.   Pregnant women.   Children with eczema.   Elderly people with transplants.   People with chronic illnesses, such as leukemia or AIDS.   Wear loose-fitting clothing to help ease   the pain of material rubbing against the rash.  Keep all follow-up appointments with your caregiver.If the area involved is on your face, you may receive a referral for follow-up to a specialist, such as an eye doctor (ophthalmologist) or an ear, nose, and throat (ENT) doctor. Keeping all follow-up appointments will help you avoid eye complications, chronic pain, or disability.  SEEK IMMEDIATE MEDICAL  CARE IF:   You have facial pain, pain around the eye area, or loss of feeling on one side of your face.  You have ear pain or ringing in your ear.  You have loss of taste.  Your pain is not relieved with prescribed medicines.   Your redness or swelling spreads.   You have more pain and swelling.  Your condition is worsening or has changed.   You have a feveror persistent symptoms for more than 2 3 days.  You have a fever and your symptoms suddenly get worse. MAKE SURE YOU:  Understand these instructions.  Will watch your condition.  Will get help right away if you are not doing well or get worse. Document Released: 10/20/2005 Document Revised: 07/14/2012 Document Reviewed: 06/03/2012 [employer] Patient Information 2014 [employer].  

## 2013-08-30 ENCOUNTER — Ambulatory Visit (INDEPENDENT_AMBULATORY_CARE_PROVIDER_SITE_OTHER): Payer: Medicare Other

## 2013-08-30 DIAGNOSIS — Z23 Encounter for immunization: Secondary | ICD-10-CM

## 2013-12-20 ENCOUNTER — Other Ambulatory Visit: Payer: Medicare Other

## 2013-12-21 ENCOUNTER — Telehealth: Payer: Self-pay | Admitting: Family Medicine

## 2013-12-21 DIAGNOSIS — M949 Disorder of cartilage, unspecified: Secondary | ICD-10-CM

## 2013-12-21 DIAGNOSIS — I10 Essential (primary) hypertension: Secondary | ICD-10-CM

## 2013-12-21 DIAGNOSIS — M899 Disorder of bone, unspecified: Secondary | ICD-10-CM

## 2013-12-21 NOTE — Telephone Encounter (Signed)
Error.. Labs were from 12/14/2012  orders placed.

## 2013-12-21 NOTE — Telephone Encounter (Signed)
Just had labs  On 2/11 .Marland Kitchen No additional labs needed.

## 2013-12-21 NOTE — Telephone Encounter (Signed)
Message copied by Jinny Sanders on Wed Dec 21, 2013  9:28 AM ------      Message from: Ellamae Sia      Created: Thu Dec 15, 2013 11:54 AM      Regarding: lab orders for Tuesday, 2.17.15       Patient is scheduled for CPX labs, please order future labs, Thanks , Terri       ------

## 2013-12-23 ENCOUNTER — Other Ambulatory Visit: Payer: Medicare Other

## 2013-12-27 ENCOUNTER — Encounter: Payer: Medicare Other | Admitting: Family Medicine

## 2014-01-24 ENCOUNTER — Other Ambulatory Visit: Payer: Self-pay | Admitting: Family Medicine

## 2014-02-09 ENCOUNTER — Other Ambulatory Visit (INDEPENDENT_AMBULATORY_CARE_PROVIDER_SITE_OTHER): Payer: Medicare HMO

## 2014-02-09 DIAGNOSIS — M949 Disorder of cartilage, unspecified: Secondary | ICD-10-CM

## 2014-02-09 DIAGNOSIS — M899 Disorder of bone, unspecified: Secondary | ICD-10-CM

## 2014-02-09 DIAGNOSIS — I10 Essential (primary) hypertension: Secondary | ICD-10-CM

## 2014-02-09 LAB — COMPREHENSIVE METABOLIC PANEL
ALBUMIN: 3.8 g/dL (ref 3.5–5.2)
ALT: 18 U/L (ref 0–35)
AST: 16 U/L (ref 0–37)
Alkaline Phosphatase: 49 U/L (ref 39–117)
BUN: 15 mg/dL (ref 6–23)
CALCIUM: 9.4 mg/dL (ref 8.4–10.5)
CHLORIDE: 106 meq/L (ref 96–112)
CO2: 26 mEq/L (ref 19–32)
Creatinine, Ser: 0.9 mg/dL (ref 0.4–1.2)
GFR: 70.68 mL/min (ref >60.00)
Glucose, Bld: 93 mg/dL (ref 70–99)
POTASSIUM: 4.2 meq/L (ref 3.5–5.1)
Sodium: 140 mEq/L (ref 135–145)
TOTAL PROTEIN: 7.3 g/dL (ref 6.0–8.3)
Total Bilirubin: 0.9 mg/dL (ref 0.3–1.2)

## 2014-02-09 LAB — LIPID PANEL
CHOL/HDL RATIO: 4
Cholesterol: 157 mg/dL (ref 0–200)
HDL: 44.6 mg/dL (ref >39.00)
LDL Cholesterol: 97 mg/dL (ref 0–99)
Triglycerides: 79 mg/dL (ref 0.0–149.0)
VLDL: 15.8 mg/dL (ref 0.0–40.0)

## 2014-02-10 LAB — VITAMIN D 25 HYDROXY (VIT D DEFICIENCY, FRACTURES): VIT D 25 HYDROXY: 43 ng/mL (ref 30–89)

## 2014-02-14 ENCOUNTER — Encounter: Payer: Self-pay | Admitting: Family Medicine

## 2014-02-14 ENCOUNTER — Ambulatory Visit (INDEPENDENT_AMBULATORY_CARE_PROVIDER_SITE_OTHER): Payer: Medicare HMO | Admitting: Family Medicine

## 2014-02-14 VITALS — BP 136/88 | HR 94 | Temp 98.3°F | Ht 62.0 in | Wt 189.2 lb

## 2014-02-14 DIAGNOSIS — I10 Essential (primary) hypertension: Secondary | ICD-10-CM

## 2014-02-14 DIAGNOSIS — Z23 Encounter for immunization: Secondary | ICD-10-CM

## 2014-02-14 DIAGNOSIS — Z0000 Encounter for general adult medical examination without abnormal findings: Secondary | ICD-10-CM

## 2014-02-14 NOTE — Assessment & Plan Note (Signed)
Well controlled. Continue current medication.  

## 2014-02-14 NOTE — Patient Instructions (Signed)
Increase exercise as able.Marland Kitchen 3-5 times a week 20 min at a time at least.  Schedule mammogram on your own.

## 2014-02-14 NOTE — Progress Notes (Signed)
Pre visit review using our clinic review tool, if applicable. No additional management support is needed unless otherwise documented below in the visit note. 

## 2014-02-14 NOTE — Progress Notes (Signed)
HPI  I have personally reviewed the Medicare Annual Wellness questionnaire and have noted  1. The patient's medical and social history  2. Their use of alcohol, tobacco or illicit drugs  3. Their current medications and supplements  4. The patient's functional ability including ADL's, fall risks, home safety risks and hearing or visual  impairment.  5. Diet and physical activities  6. Evidence for depression or mood disorders  The patients weight, height, BMI and visual acuity have been recorded in the chart  I have made referrals, counseling and provided education to the patient based review of the above and I have provided the pt with a written personalized care plan for preventive services.    Doing well overall.  Using nasocort for allergies.  Hypertension: BP well controlled on no medication.    BP Readings from Last 3 Encounters:  02/14/14 136/88  07/13/13 130/70  12/21/12 120/70  Chest pain with exertion: None  Edema: none Short of breath:None  Average home BPs: 130/70 Other issues: walking rarely for exercise, diet good control.  Wt Readings from Last 3 Encounters:  02/14/14 189 lb 4 oz (85.843 kg)  07/13/13 189 lb (85.73 kg)  12/21/12 187 lb 8 oz (85.049 kg)     At goal < 130 on no medication. Lab Results  Component Value Date   CHOL 157 02/09/2014   HDL 44.60 02/09/2014   LDLCALC 97 02/09/2014   TRIG 79.0 02/09/2014   CHOLHDL 4 02/09/2014    GERD: well controlled  on nexium 20 mg daily    She has been able to wean off premarin.   Review of Systems  Constitutional: Negative for fever, fatigue and unexpected weight change.  HENT: Negative for ear pain, congestion, sore throat, sneezing, trouble swallowing and sinus pressure.  Eyes: Negative for pain and itching.  Respiratory: Negative for cough, shortness of breath and wheezing.  Cardiovascular: Negative for chest pain, palpitations and leg swelling.  Gastrointestinal: Negative for nausea, abdominal pain, diarrhea,  constipation and blood in stool.  Genitourinary: Negative for dysuria, hematuria, vaginal discharge, difficulty urinating and menstrual problem.  Skin: Negative for rash.  Neurological: Negative for syncope, weakness, light-headedness, numbness and headaches.  Psychiatric/Behavioral: Negative for confusion and dysphoric mood. The patient is not nervous/anxious.  Objective:   Physical Exam  Constitutional: Vital signs are normal. She appears well-developed and well-nourished. She is cooperative. Non-toxic appearance. She does not appear ill. No distress.  overweight  HENT:  Head: Normocephalic.  Right Ear: Hearing, tympanic membrane, external ear and ear canal normal.  Left Ear: Hearing, tympanic membrane, external ear and ear canal normal.  Nose: Nose normal.  Eyes: Conjunctivae, EOM and lids are normal. Pupils are equal, round, and reactive to light. No foreign bodies found.  Neck: Trachea normal and normal range of motion. Neck supple. Carotid bruit is not present. No mass and no thyromegaly present.  Cardiovascular: Normal rate, regular rhythm, S1 normal, S2 normal, normal heart sounds and intact distal pulses. Exam reveals no gallop.  No murmur heard.  Pulmonary/Chest: Effort normal and breath sounds normal. No respiratory distress. She has no wheezes. She has no rhonchi. She has no rales.  Abdominal: Soft. Normal appearance and bowel sounds are normal. She exhibits no distension, no fluid wave, no abdominal bruit and no mass. There is no hepatosplenomegaly. There is no tenderness. There is no rebound, no guarding and no CVA tenderness. No hernia.  Genitourinary: No breast swelling, tenderness, discharge or bleeding. Pelvic exam was performed with patient  prone.  Lymphadenopathy:  She has no cervical adenopathy.  She has no axillary adenopathy.  Neurological: She is alert. She has normal strength. No cranial nerve deficit or sensory deficit.  Skin: Skin is warm, dry and intact. No rash  noted.  Psychiatric: Her speech is normal and behavior is normal. Judgment normal. Her mood appears not anxious. Cognition and memory are normal. She does not exhibit a depressed mood.  Assessment & Plan:   medicare wellness. The patient's preventative maintenance and recommended screening tests for an annual wellness exam were reviewed in full today.  Brought up to date unless services declined.  Counselled on the importance of diet, exercise, and its role in overall health and mortality.  The patient's FH and SH was reviewed, including their home life, tobacco status, and drug and alcohol status.   Last DEXA 2012, due for repeat in 5 years, osteopenia.  Mammogram due, will schedule on her own.  Colonoscopy 2007, rpt in 10 years, 2017  Vaccines: Uptodate with Tdap, flu, PNA , Zoster, given prevnar today DVE/pap: Not indicated.. S/p  TAH hysterectomy.

## 2014-05-04 ENCOUNTER — Ambulatory Visit (INDEPENDENT_AMBULATORY_CARE_PROVIDER_SITE_OTHER): Payer: Medicare HMO | Admitting: Family Medicine

## 2014-05-04 ENCOUNTER — Encounter: Payer: Self-pay | Admitting: Family Medicine

## 2014-05-04 VITALS — BP 140/90 | HR 86 | Temp 98.1°F | Ht 62.0 in | Wt 193.0 lb

## 2014-05-04 DIAGNOSIS — B029 Zoster without complications: Secondary | ICD-10-CM | POA: Insufficient documentation

## 2014-05-04 MED ORDER — VALACYCLOVIR HCL 1 G PO TABS: 1000.0000 mg | ORAL_TABLET | Freq: Three times a day (TID) | ORAL | Status: DC

## 2014-05-04 MED ORDER — DICLOFENAC SODIUM 75 MG PO TBEC: 75.0000 mg | DELAYED_RELEASE_TABLET | Freq: Two times a day (BID) | ORAL | Status: DC

## 2014-05-04 NOTE — Patient Instructions (Signed)
Stop ibuprofen. Start diclofenac for pain twice daily. Start valacyclovir for 7 days. Call if pain is not controlled and if not resolving.

## 2014-05-04 NOTE — Progress Notes (Signed)
   Subjective:    Patient ID: Danielle Kane, female    DOB: [birth year], 68 y.o.   MRN: [id number]  HPI  68 year old female presents with new onset skin sensitivity in last 4 days located over right lateral thigh. Skin is mildly painful to gentle touch 1-2.  No rash seen.  No pain with walking or movement. No pain in hip, no pain in low back.  No weakness, no numbness  No fever.  She has had shingles vaccine.     Review of Systems  Constitutional: Negative for fever and fatigue.  HENT: Negative for ear pain.   Eyes: Negative for pain.  Respiratory: Negative for shortness of breath.   Cardiovascular: Negative for chest pain, palpitations and leg swelling.       Objective:   Physical Exam  Constitutional: Vital signs are normal. She appears well-developed and well-nourished. She is cooperative.  Non-toxic appearance. She does not appear ill. No distress.  HENT:  Head: Normocephalic.  Right Ear: Hearing, tympanic membrane, external ear and ear canal normal. Tympanic membrane is not erythematous, not retracted and not bulging.  Left Ear: Hearing, tympanic membrane, external ear and ear canal normal. Tympanic membrane is not erythematous, not retracted and not bulging.  Nose: No mucosal edema or rhinorrhea. Right sinus exhibits no maxillary sinus tenderness and no frontal sinus tenderness. Left sinus exhibits no maxillary sinus tenderness and no frontal sinus tenderness.  Mouth/Throat: Uvula is midline, oropharynx is clear and moist and mucous membranes are normal.  Eyes: Conjunctivae, EOM and lids are normal. Pupils are equal, round, and reactive to light. Lids are everted and swept, no foreign bodies found.  Neck: Trachea normal and normal range of motion. Neck supple. Carotid bruit is not present. No mass and no thyromegaly present.  Cardiovascular: Normal rate, regular rhythm, S1 normal, S2 normal, normal heart sounds, intact distal pulses and normal pulses.  Exam reveals no  gallop and no friction rub.   No murmur heard. Pulmonary/Chest: Effort normal and breath sounds normal. Not tachypneic. No respiratory distress. She has no decreased breath sounds. She has no wheezes. She has no rhonchi. She has no rales.  Abdominal: Soft. Normal appearance and bowel sounds are normal. There is no tenderness.  Musculoskeletal:  Neg faber's and neg SLR on right  Neurological: She is alert. She has normal strength. No cranial nerve deficit or sensory deficit.  Skin: Skin is warm, dry and intact. No rash noted.     Psychiatric: Her speech is normal and behavior is normal. Judgment and thought content normal. Her mood appears not anxious. Cognition and memory are normal. She does not exhibit a depressed mood.          Assessment & Plan:

## 2014-05-04 NOTE — Assessment & Plan Note (Signed)
No rash, but otherwise typical dermatomal distribution and symptoms.  May be atypical because of past zostavax.  Treat with valcyclovir x 7 days and pain control with diclofenac.  Reviewed contagiousness ( likely low given no rash) and who to avoid.

## 2014-05-04 NOTE — Progress Notes (Signed)
Pre visit review using our clinic review tool, if applicable. No additional management support is needed unless otherwise documented below in the visit note. 

## 2014-08-11 ENCOUNTER — Ambulatory Visit (INDEPENDENT_AMBULATORY_CARE_PROVIDER_SITE_OTHER): Payer: Medicare HMO

## 2014-08-11 DIAGNOSIS — Z23 Encounter for immunization: Secondary | ICD-10-CM

## 2015-04-25 ENCOUNTER — Encounter: Payer: Self-pay | Admitting: Internal Medicine

## 2015-07-13 ENCOUNTER — Emergency Department (HOSPITAL_COMMUNITY): Payer: Medicare HMO

## 2015-07-13 ENCOUNTER — Emergency Department (HOSPITAL_COMMUNITY)
Admission: EM | Admit: 2015-07-13 | Discharge: 2015-07-13 | Disposition: A | Discharge status: Home / Self Care | Payer: Medicare HMO | Attending: Emergency Medicine | Admitting: Emergency Medicine

## 2015-07-13 ENCOUNTER — Telehealth: Payer: Self-pay | Admitting: Family Medicine

## 2015-07-13 ENCOUNTER — Encounter (HOSPITAL_COMMUNITY): Payer: Self-pay | Admitting: Emergency Medicine

## 2015-07-13 DIAGNOSIS — R079 Chest pain, unspecified: Secondary | ICD-10-CM | POA: Diagnosis not present

## 2015-07-13 DIAGNOSIS — K219 Gastro-esophageal reflux disease without esophagitis: Secondary | ICD-10-CM | POA: Insufficient documentation

## 2015-07-13 DIAGNOSIS — R002 Palpitations: Secondary | ICD-10-CM | POA: Diagnosis not present

## 2015-07-13 DIAGNOSIS — Z79899 Other long term (current) drug therapy: Secondary | ICD-10-CM | POA: Diagnosis not present

## 2015-07-13 DIAGNOSIS — R42 Dizziness and giddiness: Secondary | ICD-10-CM | POA: Insufficient documentation

## 2015-07-13 LAB — CBC
HEMATOCRIT: 43.1 % (ref 36.0–46.0)
HEMOGLOBIN: 14.3 g/dL (ref 12.0–15.0)
MCH: 28.3 pg (ref 26.0–34.0)
MCHC: 33.2 g/dL (ref 30.0–36.0)
MCV: 85.3 fL (ref 78.0–100.0)
Platelets: 240 10*3/uL (ref 150–400)
RBC: 5.05 MIL/uL (ref 3.87–5.11)
RDW: 14.4 % (ref 11.5–15.5)
WBC: 6.8 10*3/uL (ref 4.0–10.5)

## 2015-07-13 LAB — I-STAT TROPONIN, ED
TROPONIN I, POC: 0.01 ng/mL (ref 0.00–0.08)
Troponin i, poc: 0 ng/mL (ref 0.00–0.08)

## 2015-07-13 LAB — URINALYSIS, ROUTINE W REFLEX MICROSCOPIC
Bilirubin Urine: NEGATIVE
GLUCOSE, UA: NEGATIVE mg/dL
HGB URINE DIPSTICK: NEGATIVE
Ketones, ur: NEGATIVE mg/dL
LEUKOCYTES UA: NEGATIVE
Nitrite: NEGATIVE
PH: 6 (ref 5.0–8.0)
PROTEIN: NEGATIVE mg/dL
SPECIFIC GRAVITY, URINE: 1.019 (ref 1.005–1.030)
Urobilinogen, UA: 0.2 mg/dL (ref 0.0–1.0)

## 2015-07-13 LAB — BASIC METABOLIC PANEL
ANION GAP: 8 (ref 5–15)
BUN: 14 mg/dL (ref 6–20)
CALCIUM: 9.1 mg/dL (ref 8.9–10.3)
CHLORIDE: 105 mmol/L (ref 101–111)
CO2: 24 mmol/L (ref 22–32)
Creatinine, Ser: 0.93 mg/dL (ref 0.44–1.00)
GFR calc non Af Amer: >60 mL/min (ref >60)
GLUCOSE: 132 mg/dL — AB (ref 65–99)
Potassium: 4.2 mmol/L (ref 3.5–5.1)
Sodium: 137 mmol/L (ref 135–145)

## 2015-07-13 LAB — CBG MONITORING, ED: Glucose-Capillary: 90 mg/dL (ref 65–99)

## 2015-07-13 NOTE — ED Notes (Signed)
MD at bedside updating pt and family on results and discharge.

## 2015-07-13 NOTE — Telephone Encounter (Signed)
[facility]     Patient Name: Danielle Kane Initial Comment Caller states, hear is racing, then went back to normal, she feels light headed  DOB: 12/25/1945      Nurse Assessment  Nurse: Mechele Dawley, RN, Amy Date/Time [name] Time): 07/13/2015 10:42:24 AM  Confirm and document reason for call. If symptomatic, describe symptoms. ---CALLER STATES THAT SHE IS HAVING HEART RACING, LIGHT HEADED FEELING. SHE STATES THIS HAS NOT HAPPENED BEFORE. IT STARTED ABOUT AN HOUR AGO. NO CHEST PAIN. SHE STATES THAT IT HURT LAST NIGHT. SHE HAS GAS PAINS. SHE STATES SHE HAD THIS BEFORE.  Has the patient traveled out of the country within the last 30 days? ---Not Applicable  Does the patient require triage? ---Yes  Related visit to physician within the last 2 weeks? ---No  Does the PT have any chronic conditions? (i.e. diabetes, asthma, etc.) ---No    Guidelines     Guideline Title Affirmed Question Affirmed Notes   Dizziness - Lightheadedness SEVERE dizziness (e.g., unable to stand, requires support to walk, feels like passing out now)    Final Disposition User   Go to ED Now (or PCP triage) Mechele Dawley, Therapist, sports, [facility] - ED   Disagree/Comply: Comply

## 2015-07-13 NOTE — Telephone Encounter (Signed)
Per encounter note pt is at [facility] ED now.

## 2015-07-13 NOTE — Telephone Encounter (Signed)
Noted  

## 2015-07-13 NOTE — ED Provider Notes (Signed)
CSN: [id number]     Arrival date & time 07/13/15  1103 History   First MD Initiated Contact with Patient 07/13/15 1537     Chief Complaint  Patient presents with  . Dizziness  . Chest Pain     (Consider location/radiation/quality/duration/timing/severity/associated sxs/prior Treatment) Patient is a 69 y.o. female presenting with dizziness and chest pain.  Dizziness Associated symptoms: chest pain and palpitations (this AM lasted for a few minutes)   Associated symptoms: no headaches, no nausea, no shortness of breath, no syncope, no vomiting and no weakness   Chest Pain Pain location:  Substernal area Pain quality comment:  "just hurt" Pain radiates to the back: yes   Pain severity:  Severe Duration:  45 minutes Timing:  Intermittent Progression:  Resolved Chronicity:  Recurrent Context: at rest (has had episodes similar to this at rest at night)   Relieved by:  Certain positions and leaning forward (gas-ex) Exacerbated by: not exertional. Associated symptoms: dizziness (immediately after palpitations, lasted minutes) and palpitations (this AM lasted for a few minutes)   Associated symptoms: no abdominal pain, no back pain, no cough, no diaphoresis, no fever, no headache, no nausea, no shortness of breath, no syncope, not vomiting and no weakness   Risk factors: no aortic disease, no birth control, no coronary artery disease, no diabetes mellitus, no high cholesterol, no hypertension, no prior DVT/PE, no smoking and no surgery   Risk factors comment:  No family history of CAD under 69   Past Medical History  Diagnosis Date  . GERD (gastroesophageal reflux disease)    Past Surgical History  Procedure Laterality Date  . Abdominal hysterectomy     Family History  Problem Relation Age of Onset  . Heart failure Mother   . Stroke Father   . Kidney disease Maternal Grandmother   . Coronary artery disease Maternal Grandfather   . Heart attack Maternal Grandfather   .  Osteoporosis Paternal Grandfather    Social History  Substance Use Topics  . Smoking status: Never Smoker   . Smokeless tobacco: Never Used  . Alcohol Use: No   OB History    No data available     Review of Systems  Constitutional: Negative for fever and diaphoresis.  HENT: Negative for sore throat.   Eyes: Negative for visual disturbance.  Respiratory: Negative for cough and shortness of breath.   Cardiovascular: Positive for chest pain and palpitations (this AM lasted for a few minutes). Negative for syncope.  Gastrointestinal: Negative for nausea, vomiting and abdominal pain.  Genitourinary: Negative for difficulty urinating.  Musculoskeletal: Negative for back pain and neck pain.  Skin: Negative for rash.  Neurological: Positive for dizziness (immediately after palpitations, lasted minutes) and light-headedness. Negative for syncope, weakness and headaches.      Allergies  Review of patient's allergies indicates no known allergies.  Home Medications   Prior to Admission medications   Medication Sig Start Date End Date Taking? Authorizing Provider  Calcium Carbonate-Vitamin D (CALCIUM 600-D) 600-400 MG-UNIT per tablet Take 1 tablet by mouth daily.     Yes Historical Provider, MD  esomeprazole (NEXIUM) 20 MG capsule Take 20 mg by mouth daily at 12 noon.   Yes Historical Provider, MD  ibuprofen (ADVIL,MOTRIN) 200 MG tablet Take 400 mg by mouth every 6 (six) hours as needed for moderate pain.   Yes Historical Provider, MD  Multiple Vitamin (MULTIVITAMIN) tablet Take 1 tablet by mouth daily.     Yes Historical Provider, MD  Simethicone (GAS  RELIEF) 180 MG CAPS Take 180 mg by mouth daily as needed (for bloating).   Yes Historical Provider, MD  diclofenac (VOLTAREN) 75 MG EC tablet Take 1 tablet (75 mg total) by mouth 2 (two) times daily. 05/04/14   Amy Cletis Athens, MD  valACYclovir (VALTREX) 1000 MG tablet Take 1 tablet (1,000 mg total) by mouth 3 (three) times daily. 05/04/14   Amy E  Diona Browner, MD   BP 159/77 mmHg  Pulse 79  Temp(Src) 98 F (36.7 C) (Oral)  Resp 16  Ht 5\' 2"  (1.575 m)  Wt 195 lb (88.451 kg)  BMI 35.66 kg/m2  SpO2 99% Physical Exam  Constitutional: She is oriented to person, place, and time. She appears well-developed and well-nourished. No distress.  HENT:  Head: Normocephalic and atraumatic.  Eyes: Conjunctivae and EOM are normal.  Neck: Normal range of motion.  Cardiovascular: Normal rate, regular rhythm, normal heart sounds and intact distal pulses.  Exam reveals no gallop and no friction rub.   No murmur heard. Pulmonary/Chest: Effort normal and breath sounds normal. No respiratory distress. She has no wheezes. She has no rales.  Abdominal: Soft. She exhibits no distension. There is no tenderness. There is no guarding.  Musculoskeletal: She exhibits no edema or tenderness.  Neurological: She is alert and oriented to person, place, and time.  Skin: Skin is warm and dry. No rash noted. She is not diaphoretic. No erythema.  Nursing note and vitals reviewed.   ED Course  Procedures (including critical care time) Labs Review Labs Reviewed  BASIC METABOLIC PANEL - Abnormal; Notable for the following:    Glucose, Bld 132 (*)    All other components within normal limits  CBC  URINALYSIS, ROUTINE W REFLEX MICROSCOPIC (NOT AT [facility])  CBG MONITORING, ED  I-STAT TROPOININ, ED  Randolm Idol, ED    Imaging Review Dg Chest 2 View  07/13/2015   CLINICAL DATA:  substernal cp that began last night with dizziness  EXAM: CHEST  2 VIEW  COMPARISON:  None.  FINDINGS: Heart size mildly enlarged. Vascular pattern normal. Lungs clear. No pleural effusion.  IMPRESSION: Mild cardiac enlargement.   Electronically Signed   By: Skipper Cliche M.D.   On: 07/13/2015 18:04   I have personally reviewed and evaluated these images and lab results as part of my medical decision-making.   EKG Interpretation   Date/Time:  Friday July 13 2015 11:09:15  EDT Ventricular Rate:  108 PR Interval:  132 QRS Duration: 80 QT Interval:  342 QTC Calculation: 458 R Axis:   9 Text Interpretation:  Sinus tachycardia Cannot rule out Anterior infarct ,  age undetermined Abnormal ECG No previous ECGs available Confirmed by  [facility] MD, Genelda Roark ([id number]) on 07/13/2015 3:51:42 PM      MDM   Final diagnoses:  Chest pain, unspecified chest pain type  Lightheadedness  Palpitations   69 year old female with history of GERD presents with concern of chest pain. Differential diagnosis for chest pain includes pulmonary embolus, dissection, pneumothorax, pneumonia, ACS, myocarditis, pericarditis.  EKG was done and evaluate by me and showed no acute ST changes and no signs of pericarditis.  Discussed EKG with Cardiology, Dr. Gwenlyn Found, given some elevation in aVR however did not feel this was indication for admission. Chest x-ray was done and evaluated by me and radiology and showed no sign of pneumonia or pneumothorax however did show mild cardiac enlargement without signs of edema. Patient is low risk Wells and have low suspicion for PE.  Patient  is low risk HEART score (3) and had delta troponins which were both negative. Given this evaluation, history and physical have low suspicion for pulmonary embolus, pneumonia, ACS, myocarditis, pericarditis, dissection.   CP at rest after meals may be secondary to reflux, however recommend close outpatient follow up for CP and outpt echo/stress testing, and return to the ED for worsening symptoms. Patient discharged in stable condition with understanding of reasons to return.   Gareth Morgan, MD 07/14/15 [phone]

## 2015-07-13 NOTE — ED Notes (Signed)
Pt from home for eval of substernal cp that began last night with dizziness. Pt states she went to work this morning and started to have dizziness again with heart palpitations. Pt states she felt weak, no neuro deficits noted. Axo x4.

## 2015-07-20 ENCOUNTER — Ambulatory Visit (INDEPENDENT_AMBULATORY_CARE_PROVIDER_SITE_OTHER): Payer: Medicare HMO | Admitting: Family Medicine

## 2015-07-20 ENCOUNTER — Encounter: Payer: Self-pay | Admitting: Family Medicine

## 2015-07-20 VITALS — BP 120/90 | HR 82 | Temp 98.4°F | Ht 62.0 in | Wt 192.8 lb

## 2015-07-20 DIAGNOSIS — K219 Gastro-esophageal reflux disease without esophagitis: Secondary | ICD-10-CM

## 2015-07-20 DIAGNOSIS — R079 Chest pain, unspecified: Secondary | ICD-10-CM | POA: Diagnosis not present

## 2015-07-20 DIAGNOSIS — Z23 Encounter for immunization: Secondary | ICD-10-CM

## 2015-07-20 DIAGNOSIS — I10 Essential (primary) hypertension: Secondary | ICD-10-CM

## 2015-07-20 DIAGNOSIS — Z6835 Body mass index (BMI) 35.0-35.9, adult: Secondary | ICD-10-CM | POA: Insufficient documentation

## 2015-07-20 NOTE — Progress Notes (Signed)
Subjective:    Patient ID: Danielle Kane, female    DOB: 1945-11-06, 69 y.o.   MRN: [id number]  HPI  68 year old female patient with history of hypertension and GERd presents for ER follow up of chest pain.  She was seen on 07/13/2015 for dizziness and chest pain.  She has been having some knife like pain in fraont and back.. Felt like gas. Last week this was different with "feeling funny".  EKG: NSR, no acute changes except some elevation in AVR. Consulted cards, Dr. Gwenlyn Found, felt not reason for admission.  CXR: mild cardiac enlargement, no pulmonary process Patient: low risk Wells and have low suspicion for PE. Patient low risk HEART score (3) and had delta troponins which were both negative.  Felt CP at rest likely due to GERD but recommended ECHO and stress testing to eval further.  Cardiac risk factors: Age, HTN.  Nonsmoker No family history of early CAD No cholesterol issue but last check in 2015    Today she reports:  She has not had any more chest pain. No SOB, no dizziness, no fatigue.   She has been under a lot of stress this year.Marland Kitchen occ panicky, no depression, no generalized anxiety.  She is using nexium for GERD.  Diclofenac once every week or 2.  HTN, on no medicaitons BP Readings from Last 3 Encounters:  07/20/15 120/90  07/13/15 159/77  05/04/14 140/90   Body mass index is 35.25 kg/(m^2).  Review of Systems  Constitutional: Negative for fever and fatigue.  HENT: Negative for ear pain.   Eyes: Negative for pain.  Respiratory: Negative for chest tightness and shortness of breath.   Cardiovascular: Negative for chest pain, palpitations and leg swelling.  Gastrointestinal: Negative for abdominal pain.  Genitourinary: Negative for dysuria.       Objective:   Physical Exam  Constitutional: Vital signs are normal. She appears well-developed and well-nourished. She is cooperative.  Non-toxic appearance. She does not appear ill. No distress.  HENT:  Head:  Normocephalic.  Right Ear: Hearing, tympanic membrane, external ear and ear canal normal. Tympanic membrane is not erythematous, not retracted and not bulging.  Left Ear: Hearing, tympanic membrane, external ear and ear canal normal. Tympanic membrane is not erythematous, not retracted and not bulging.  Nose: No mucosal edema or rhinorrhea. Right sinus exhibits no maxillary sinus tenderness and no frontal sinus tenderness. Left sinus exhibits no maxillary sinus tenderness and no frontal sinus tenderness.  Mouth/Throat: Uvula is midline, oropharynx is clear and moist and mucous membranes are normal.  Eyes: Conjunctivae, EOM and lids are normal. Pupils are equal, round, and reactive to light. Lids are everted and swept, no foreign bodies found.  Neck: Trachea normal and normal range of motion. Neck supple. Carotid bruit is not present. No thyroid mass and no thyromegaly present.  Cardiovascular: Normal rate, regular rhythm, S1 normal, S2 normal, normal heart sounds, intact distal pulses and normal pulses.  Exam reveals no gallop and no friction rub.   No murmur heard. Pulmonary/Chest: Effort normal and breath sounds normal. No tachypnea. No respiratory distress. She has no decreased breath sounds. She has no wheezes. She has no rhonchi. She has no rales.  Abdominal: Soft. Normal appearance and bowel sounds are normal. There is no tenderness.  Neurological: She is alert.  Skin: Skin is warm, dry and intact. No rash noted.  Psychiatric: Her speech is normal and behavior is normal. Judgment and thought content normal. Her mood appears not anxious.  Cognition and memory are normal. She does not exhibit a depressed mood.          Assessment & Plan:

## 2015-07-20 NOTE — Assessment & Plan Note (Signed)
Avoid triggers. Increase nexium to 40 mg daily x 2-4 weeks then decrease back to usual dose.

## 2015-07-20 NOTE — Assessment & Plan Note (Addendum)
Borderline control.. Follow at home and call with measurements.. May need to start medication to treat.

## 2015-07-20 NOTE — Assessment & Plan Note (Signed)
Moderate cardiac risk with AGE, obesity, HTN. ? Cholesterol but in past nml. Will eval with stress echo. Most likely due to GERD versus stress/anxiety.

## 2015-07-20 NOTE — Progress Notes (Signed)
Pre visit review using our clinic review tool, if applicable. No additional management support is needed unless otherwise documented below in the visit note. 

## 2015-07-20 NOTE — Patient Instructions (Addendum)
Increase nexium to 2 tabs daily for 2-4 week then can try to return to lower dose. Avoid triggers. Work on stress reduction, relaxation.  Follow BP at home and call with measurements in 2 weeks given borderline high today. Stop at front desk on way put to set up fasting labs, and referral for heart study.

## 2015-07-24 ENCOUNTER — Other Ambulatory Visit (INDEPENDENT_AMBULATORY_CARE_PROVIDER_SITE_OTHER): Payer: Medicare HMO

## 2015-07-24 ENCOUNTER — Encounter: Payer: Self-pay | Admitting: *Deleted

## 2015-07-24 DIAGNOSIS — R079 Chest pain, unspecified: Secondary | ICD-10-CM | POA: Diagnosis not present

## 2015-07-24 LAB — COMPREHENSIVE METABOLIC PANEL
ALT: 12 U/L (ref 0–35)
AST: 13 U/L (ref 0–37)
Albumin: 3.7 g/dL (ref 3.5–5.2)
Alkaline Phosphatase: 56 U/L (ref 39–117)
BUN: 17 mg/dL (ref 6–23)
CHLORIDE: 107 meq/L (ref 96–112)
CO2: 28 meq/L (ref 19–32)
Calcium: 9.1 mg/dL (ref 8.4–10.5)
Creatinine, Ser: 0.94 mg/dL (ref 0.40–1.20)
GFR: 62.66 mL/min (ref >60.00)
GLUCOSE: 95 mg/dL (ref 70–99)
POTASSIUM: 4.6 meq/L (ref 3.5–5.1)
SODIUM: 140 meq/L (ref 135–145)
Total Bilirubin: 0.7 mg/dL (ref 0.2–1.2)
Total Protein: 7.3 g/dL (ref 6.0–8.3)

## 2015-07-24 LAB — LIPID PANEL
CHOL/HDL RATIO: 3
CHOLESTEROL: 140 mg/dL (ref 0–200)
HDL: 44.1 mg/dL (ref >39.00)
LDL CALC: 77 mg/dL (ref 0–99)
NONHDL: 96.06
Triglycerides: 93 mg/dL (ref 0.0–149.0)
VLDL: 18.6 mg/dL (ref 0.0–40.0)

## 2015-07-26 ENCOUNTER — Other Ambulatory Visit (HOSPITAL_COMMUNITY): Payer: Medicare HMO

## 2015-08-06 ENCOUNTER — Other Ambulatory Visit (HOSPITAL_COMMUNITY): Payer: Medicare HMO

## 2015-08-16 ENCOUNTER — Ambulatory Visit (HOSPITAL_COMMUNITY): Payer: Medicare HMO | Attending: Cardiology

## 2015-08-16 ENCOUNTER — Ambulatory Visit (HOSPITAL_BASED_OUTPATIENT_CLINIC_OR_DEPARTMENT_OTHER): Payer: Medicare HMO

## 2015-08-16 DIAGNOSIS — R079 Chest pain, unspecified: Secondary | ICD-10-CM | POA: Diagnosis not present

## 2015-08-16 DIAGNOSIS — R0989 Other specified symptoms and signs involving the circulatory and respiratory systems: Secondary | ICD-10-CM

## 2015-08-16 LAB — ECHOCARDIOGRAM STRESS TEST
CHL CUP MPHR: 151 {beats}/min
CHL CUP RESTING HR STRESS: 94 {beats}/min
CSEPEDS: 26 s
Estimated workload: 4.7 METS
Exercise duration (min): 3 min
Peak HR: 166 {beats}/min

## 2015-08-16 MED ORDER — PERFLUTREN LIPID MICROSPHERE
3.0000 mL | INTRAVENOUS | Status: AC | PRN
  Administered 2015-08-16: 3 mL via INTRAVENOUS

## 2015-08-16 NOTE — Progress Notes (Signed)
30-minute post Definity. BP 148/82. Pulse 95 bpm. No symptoms.

## 2015-08-16 NOTE — Progress Notes (Signed)
Erroneous encouter This encounter was created in error - please disregard. 

## 2016-01-15 ENCOUNTER — Telehealth: Payer: Self-pay | Admitting: Family Medicine

## 2016-01-15 ENCOUNTER — Other Ambulatory Visit (INDEPENDENT_AMBULATORY_CARE_PROVIDER_SITE_OTHER): Payer: Medicare HMO

## 2016-01-15 ENCOUNTER — Other Ambulatory Visit: Payer: Self-pay | Admitting: Family Medicine

## 2016-01-15 DIAGNOSIS — M8580 Other specified disorders of bone density and structure, unspecified site: Secondary | ICD-10-CM | POA: Diagnosis not present

## 2016-01-15 DIAGNOSIS — Z1159 Encounter for screening for other viral diseases: Secondary | ICD-10-CM

## 2016-01-15 DIAGNOSIS — I10 Essential (primary) hypertension: Secondary | ICD-10-CM | POA: Diagnosis not present

## 2016-01-15 LAB — COMPREHENSIVE METABOLIC PANEL
ALT: 15 U/L (ref 0–35)
AST: 16 U/L (ref 0–37)
Albumin: 3.7 g/dL (ref 3.5–5.2)
Alkaline Phosphatase: 50 U/L (ref 39–117)
BUN: 20 mg/dL (ref 6–23)
CO2: 25 mEq/L (ref 19–32)
Calcium: 9.4 mg/dL (ref 8.4–10.5)
Chloride: 107 mEq/L (ref 96–112)
Creatinine, Ser: 0.93 mg/dL (ref 0.40–1.20)
GFR: 63.35 mL/min (ref >60.00)
Glucose, Bld: 99 mg/dL (ref 70–99)
Potassium: 4.6 mEq/L (ref 3.5–5.1)
Sodium: 142 mEq/L (ref 135–145)
Total Bilirubin: 0.7 mg/dL (ref 0.2–1.2)
Total Protein: 7.2 g/dL (ref 6.0–8.3)

## 2016-01-15 LAB — VITAMIN D 25 HYDROXY (VIT D DEFICIENCY, FRACTURES): VITD: 29.19 ng/mL — ABNORMAL LOW (ref 30.00–100.00)

## 2016-01-15 LAB — LIPID PANEL
Cholesterol: 143 mg/dL (ref 0–200)
HDL: 45.6 mg/dL (ref >39.00)
LDL Cholesterol: 83 mg/dL (ref 0–99)
NonHDL: 97.16
Total CHOL/HDL Ratio: 3
Triglycerides: 71 mg/dL (ref 0.0–149.0)
VLDL: 14.2 mg/dL (ref 0.0–40.0)

## 2016-01-15 NOTE — Telephone Encounter (Signed)
-----   Message from Ellamae Sia sent at 01/10/2016 10:19 AM EST ----- Regarding: Lab orders for Tuesday, 3.14.17 AWV lab orders

## 2016-01-16 LAB — HEPATITIS C ANTIBODY: HCV AB: NEGATIVE

## 2016-01-22 ENCOUNTER — Ambulatory Visit (INDEPENDENT_AMBULATORY_CARE_PROVIDER_SITE_OTHER): Payer: Medicare HMO | Admitting: Family Medicine

## 2016-01-22 ENCOUNTER — Encounter: Payer: Self-pay | Admitting: Family Medicine

## 2016-01-22 VITALS — BP 138/88 | HR 84 | Temp 97.8°F | Ht 62.0 in | Wt 193.2 lb

## 2016-01-22 DIAGNOSIS — Z0000 Encounter for general adult medical examination without abnormal findings: Secondary | ICD-10-CM | POA: Diagnosis not present

## 2016-01-22 DIAGNOSIS — M8580 Other specified disorders of bone density and structure, unspecified site: Secondary | ICD-10-CM | POA: Diagnosis not present

## 2016-01-22 DIAGNOSIS — Z7189 Other specified counseling: Secondary | ICD-10-CM

## 2016-01-22 DIAGNOSIS — K219 Gastro-esophageal reflux disease without esophagitis: Secondary | ICD-10-CM | POA: Diagnosis not present

## 2016-01-22 DIAGNOSIS — I10 Essential (primary) hypertension: Secondary | ICD-10-CM

## 2016-01-22 DIAGNOSIS — Z1231 Encounter for screening mammogram for malignant neoplasm of breast: Secondary | ICD-10-CM

## 2016-01-22 NOTE — Patient Instructions (Addendum)
Increase Ca and Vit D to twice daily. Stop at front desk on way out to set up DEXA and mammogram.  Call GI for colonscopy repeat.

## 2016-01-22 NOTE — Progress Notes (Signed)
Pre visit review using our clinic review tool, if applicable. No additional management support is needed unless otherwise documented below in the visit note. 

## 2016-01-22 NOTE — Assessment & Plan Note (Signed)
Well controlled. Continue current medication.  

## 2016-01-22 NOTE — Assessment & Plan Note (Signed)
Son Corene Cornea is HCPOA, has living will (Reviewed 2017)

## 2016-01-22 NOTE — Progress Notes (Signed)
I have personally reviewed the Medicare Annual Wellness questionnaire and have noted 1. The patient's medical and social history 2. Their use of alcohol, tobacco or illicit drugs 3. Their current medications and supplements 4. The patient's functional ability including ADL's, fall risks, home safety risks and hearing or visual             impairment. 5. Diet and physical activities 6. Evidence for depression or mood disorders 7.         Updated provider list Cognitive evaluation was performed and recorded on pt medicare questionnaire form. The patients weight, height, BMI and visual acuity have been recorded in the chart  I have made referrals, counseling and provided education to the patient based review of the above and I have provided the pt with a written personalized care plan for preventive services.  Doing well overall.  Using nasocort for allergies.  Hypertension: BP well controlled on no medication.   BP Readings from Last 3 Encounters:  01/22/16 138/88  07/20/15 120/90  07/13/15 159/77  Chest pain with exertion: None  Edema: none Short of breath:None  Average home BPs: 130/80 Other issues: Walking rarely for exercise, Diet good control.  Wt Readings from Last 3 Encounters:  01/22/16 193 lb 4 oz (87.658 kg)  07/20/15 192 lb 12 oz (87.431 kg)  07/13/15 195 lb (88.451 kg)   At goal < 130 on no medication. Lab Results  Component Value Date   CHOL 143 01/15/2016   HDL 45.60 01/15/2016   LDLCALC 83 01/15/2016   TRIG 71.0 01/15/2016   CHOLHDL 3 01/15/2016   GERD: well controlled on nexium 20 mg daily   She has been able to wean off premarin.  Taking vit D and Ca supplement  Review of Systems  Constitutional: Negative for fever, fatigue and unexpected weight change.  HENT: Negative for ear pain, congestion, sore throat, sneezing, trouble swallowing and sinus pressure.  Eyes: Negative for pain and itching.  Respiratory: Negative for cough, shortness of  breath and wheezing.  Cardiovascular: Negative for chest pain, palpitations and leg swelling.  Gastrointestinal: Negative for nausea, abdominal pain, diarrhea, constipation and blood in stool.  Genitourinary: Negative for dysuria, hematuria, vaginal discharge, difficulty urinating and menstrual problem.  Skin: Negative for rash.  Neurological: Negative for syncope, weakness, light-headedness, numbness and headaches.  Psychiatric/Behavioral: Negative for confusion and dysphoric mood. The patient is not nervous/anxious.  Objective:   Physical Exam  Constitutional: Vital signs are normal. She appears well-developed and well-nourished. She is cooperative. Non-toxic appearance. She does not appear ill. No distress.  overweight  HENT:  Head: Normocephalic.  Right Ear: Hearing, tympanic membrane, external ear and ear canal normal.  Left Ear: Hearing, tympanic membrane, external ear and ear canal normal.  Nose: Nose normal.  Eyes: Conjunctivae, EOM and lids are normal. Pupils are equal, round, and reactive to light. No foreign bodies found.  Neck: Trachea normal and normal range of motion. Neck supple. Carotid bruit is not present. No mass and no thyromegaly present.  Cardiovascular: Normal rate, regular rhythm, S1 normal, S2 normal, normal heart sounds and intact distal pulses. Exam reveals no gallop.  No murmur heard.  Pulmonary/Chest: Effort normal and breath sounds normal. No respiratory distress. She has no wheezes. She has no rhonchi. She has no rales.  Abdominal: Soft. Normal appearance and bowel sounds are normal. She exhibits no distension, no fluid wave, no abdominal bruit and no mass. There is no hepatosplenomegaly. There is no tenderness. There is no rebound,  no guarding and no CVA tenderness. No hernia.  Genitourinary: No breast swelling, tenderness, discharge or bleeding. Pelvic exam was performed with patient prone.  Lymphadenopathy:  She has no cervical adenopathy.   She has no axillary adenopathy.  Neurological: She is alert. She has normal strength. No cranial nerve deficit or sensory deficit.  Skin: Skin is warm, dry and intact. No rash noted.  Psychiatric: Her speech is normal and behavior is normal. Judgment normal. Her mood appears not anxious. Cognition and memory are normal. She does not exhibit a depressed mood.  Assessment & Plan:   medicare wellness. The patient's preventative maintenance and recommended screening tests for an annual wellness exam were reviewed in full today.  Brought up to date unless services declined.  Counselled on the importance of diet, exercise, and its role in overall health and mortality.  The patient's FH and SH was reviewed, including their home life, tobacco status, and drug and alcohol status.   Last DEXA 2012, due for repeat in 5 years, osteopenia.  Mammogram due.  Colonoscopy 2007, rpt in 10 years, 2017,   Vaccines: Uptodate with Tdap, flu, PNA , Zoster, prevnar DVE/pap: Not indicated.. S/p TAH hysterectomy.      Hep C: Done

## 2016-01-22 NOTE — Assessment & Plan Note (Signed)
Stable on current med

## 2016-02-12 ENCOUNTER — Other Ambulatory Visit: Payer: Medicare HMO

## 2016-02-12 ENCOUNTER — Ambulatory Visit: Payer: Medicare HMO

## 2016-02-21 ENCOUNTER — Ambulatory Visit
Admission: RE | Admit: 2016-02-21 | Discharge: 2016-02-21 | Disposition: A | Discharge status: Home / Self Care | Payer: Medicare HMO | Source: Ambulatory Visit | Attending: Family Medicine | Admitting: Family Medicine

## 2016-02-21 DIAGNOSIS — Z780 Asymptomatic menopausal state: Secondary | ICD-10-CM | POA: Diagnosis not present

## 2016-02-21 DIAGNOSIS — Z1231 Encounter for screening mammogram for malignant neoplasm of breast: Secondary | ICD-10-CM

## 2016-02-21 DIAGNOSIS — M8580 Other specified disorders of bone density and structure, unspecified site: Secondary | ICD-10-CM

## 2016-02-21 DIAGNOSIS — M8589 Other specified disorders of bone density and structure, multiple sites: Secondary | ICD-10-CM | POA: Diagnosis not present

## 2016-08-28 DIAGNOSIS — D0471 Carcinoma in situ of skin of right lower limb, including hip: Secondary | ICD-10-CM | POA: Diagnosis not present

## 2016-08-28 DIAGNOSIS — B353 Tinea pedis: Secondary | ICD-10-CM | POA: Diagnosis not present

## 2016-08-28 DIAGNOSIS — L821 Other seborrheic keratosis: Secondary | ICD-10-CM | POA: Diagnosis not present

## 2016-08-29 ENCOUNTER — Ambulatory Visit (INDEPENDENT_AMBULATORY_CARE_PROVIDER_SITE_OTHER): Payer: Medicare HMO

## 2016-08-29 DIAGNOSIS — Z23 Encounter for immunization: Secondary | ICD-10-CM

## 2016-09-10 DIAGNOSIS — D0471 Carcinoma in situ of skin of right lower limb, including hip: Secondary | ICD-10-CM | POA: Diagnosis not present

## 2017-01-19 ENCOUNTER — Telehealth: Payer: Self-pay | Admitting: Family Medicine

## 2017-01-19 DIAGNOSIS — M8580 Other specified disorders of bone density and structure, unspecified site: Secondary | ICD-10-CM

## 2017-01-19 DIAGNOSIS — I10 Essential (primary) hypertension: Secondary | ICD-10-CM

## 2017-01-19 NOTE — Telephone Encounter (Signed)
-----   Message from Ellamae Sia sent at 01/13/2017  3:16 PM EDT ----- Regarding: Lab orders for Thursday, 3.22.18 Patient is scheduled for CPX labs, please order future labs, Thanks , Karna Christmas

## 2017-01-22 ENCOUNTER — Other Ambulatory Visit (INDEPENDENT_AMBULATORY_CARE_PROVIDER_SITE_OTHER): Payer: Medicare HMO

## 2017-01-22 DIAGNOSIS — I10 Essential (primary) hypertension: Secondary | ICD-10-CM | POA: Diagnosis not present

## 2017-01-22 DIAGNOSIS — M8580 Other specified disorders of bone density and structure, unspecified site: Secondary | ICD-10-CM

## 2017-01-22 LAB — COMPREHENSIVE METABOLIC PANEL
ALK PHOS: 50 U/L (ref 39–117)
ALT: 14 U/L (ref 0–35)
AST: 15 U/L (ref 0–37)
Albumin: 3.7 g/dL (ref 3.5–5.2)
BUN: 18 mg/dL (ref 6–23)
CO2: 29 mEq/L (ref 19–32)
Calcium: 9.6 mg/dL (ref 8.4–10.5)
Chloride: 107 mEq/L (ref 96–112)
Creatinine, Ser: 0.94 mg/dL (ref 0.40–1.20)
GFR: 62.39 mL/min (ref >60.00)
GLUCOSE: 100 mg/dL — AB (ref 70–99)
POTASSIUM: 4.6 meq/L (ref 3.5–5.1)
Sodium: 141 mEq/L (ref 135–145)
TOTAL PROTEIN: 7 g/dL (ref 6.0–8.3)
Total Bilirubin: 0.8 mg/dL (ref 0.2–1.2)

## 2017-01-22 LAB — LIPID PANEL
CHOL/HDL RATIO: 3
Cholesterol: 146 mg/dL (ref 0–200)
HDL: 44.7 mg/dL (ref >39.00)
LDL CALC: 85 mg/dL (ref 0–99)
NONHDL: 101.19
Triglycerides: 83 mg/dL (ref 0.0–149.0)
VLDL: 16.6 mg/dL (ref 0.0–40.0)

## 2017-01-22 LAB — VITAMIN D 25 HYDROXY (VIT D DEFICIENCY, FRACTURES): VITD: 31.41 ng/mL (ref 30.00–100.00)

## 2017-01-27 ENCOUNTER — Encounter: Payer: Medicare HMO | Admitting: Family Medicine

## 2017-01-29 ENCOUNTER — Encounter: Payer: Self-pay | Admitting: Family Medicine

## 2017-01-29 ENCOUNTER — Ambulatory Visit (INDEPENDENT_AMBULATORY_CARE_PROVIDER_SITE_OTHER): Payer: Medicare HMO | Admitting: Family Medicine

## 2017-01-29 VITALS — BP 138/80 | HR 78 | Temp 97.9°F | Ht 61.75 in | Wt 194.0 lb

## 2017-01-29 DIAGNOSIS — Z0000 Encounter for general adult medical examination without abnormal findings: Secondary | ICD-10-CM | POA: Diagnosis not present

## 2017-01-29 DIAGNOSIS — Z6835 Body mass index (BMI) 35.0-35.9, adult: Secondary | ICD-10-CM

## 2017-01-29 DIAGNOSIS — I10 Essential (primary) hypertension: Secondary | ICD-10-CM

## 2017-01-29 NOTE — Assessment & Plan Note (Signed)
Well controlled. Continue current medication.  

## 2017-01-29 NOTE — Progress Notes (Signed)
Pre visit review using our clinic review tool, if applicable. No additional management support is needed unless otherwise documented below in the visit note. 

## 2017-01-29 NOTE — Progress Notes (Signed)
Subjective:    Patient ID: Danielle Kane, female    DOB: [birth year], 71 y.o.   MRN: [id number]  HPI  I have personally reviewed the Medicare Annual Wellness questionnaire and have noted 1. The patient's medical and social history 2. Their use of alcohol, tobacco or illicit drugs 3. Their current medications and supplements 4. The patient's functional ability including ADL's, fall risks, home safety risks and hearing or visual             impairment. 5. Diet and physical activities 6. Evidence for depression or mood disorders 7.         Updated provider list Cognitive evaluation was performed and recorded on pt medicare questionnaire form. The patients weight, height, BMI and visual acuity have been recorded in the chart  I have made referrals, counseling and provided education to the patient based review of the above and I have provided the pt with a written personalized care plan for preventive services.   Hypertension:  Good control on no med. BP Readings from Last 3 Encounters:  01/29/17 138/80  01/22/16 138/88  07/20/15 120/90  Using medication without problems or lightheadedness:  none Chest pain with exertion: none Edema:none Short of breath: none Average home BPs: not checking. Other issues:  Reviewed labs in detail Lab Results  Component Value Date   CHOL 146 01/22/2017   HDL 44.70 01/22/2017   LDLCALC 85 01/22/2017   TRIG 83.0 01/22/2017   CHOLHDL 3 01/22/2017   No exercise  Healthy diet.  Body mass index is 35.77 kg/m. Wt Readings from Last 3 Encounters:  01/29/17 194 lb (88 kg)  01/22/16 193 lb 4 oz (87.7 kg)  07/20/15 192 lb 12 oz (87.4 kg)     GERD stable control on nexium 20 mg dialy.    Social History /Family History/Past Medical History reviewed and updated if needed. Blood pressure 138/80, pulse 78, temperature 97.9 F (36.6 C), temperature source Oral, height 5' 1.75" (1.568 m), weight 194 lb (88 kg), SpO2 97 %.  Review of Systems    Constitutional: Negative for fatigue and fever.  HENT: Negative for congestion.   Eyes: Negative for pain.  Respiratory: Negative for cough and shortness of breath.   Cardiovascular: Negative for chest pain, palpitations and leg swelling.  Gastrointestinal: Negative for abdominal pain.  Genitourinary: Negative for dysuria and vaginal bleeding.  Musculoskeletal: Negative for back pain.  Neurological: Negative for syncope, light-headedness and headaches.  Psychiatric/Behavioral: Negative for dysphoric mood.       Objective:   Physical Exam  Constitutional: Vital signs are normal. She appears well-developed and well-nourished. She is cooperative.  Non-toxic appearance. She does not appear ill. No distress.  obesity  HENT:  Head: Normocephalic.  Right Ear: Hearing, tympanic membrane, external ear and ear canal normal.  Left Ear: Hearing, tympanic membrane, external ear and ear canal normal.  Nose: Nose normal.  Eyes: Conjunctivae, EOM and lids are normal. Pupils are equal, round, and reactive to light. Lids are everted and swept, no foreign bodies found.  Neck: Trachea normal and normal range of motion. Neck supple. Carotid bruit is not present. No thyroid mass and no thyromegaly present.  Cardiovascular: Normal rate, regular rhythm, S1 normal, S2 normal, normal heart sounds and intact distal pulses.  Exam reveals no gallop.   No murmur heard. Pulmonary/Chest: Effort normal and breath sounds normal. No respiratory distress. She has no wheezes. She has no rhonchi. She has no rales.  Abdominal: Soft. Normal appearance and  bowel sounds are normal. She exhibits no distension, no fluid wave, no abdominal bruit and no mass. There is no hepatosplenomegaly. There is no tenderness. There is no rebound, no guarding and no CVA tenderness. No hernia.  Lymphadenopathy:    She has no cervical adenopathy.    She has no axillary adenopathy.  Neurological: She is alert. She has normal strength. No  cranial nerve deficit or sensory deficit.  Skin: Skin is warm, dry and intact. No rash noted.  Psychiatric: Her speech is normal and behavior is normal. Judgment normal. Her mood appears not anxious. Cognition and memory are normal. She does not exhibit a depressed mood.          Assessment & Plan:  The patient's preventative maintenance and recommended screening tests for an annual wellness exam were reviewed in full today. Brought up to date unless services declined.  Counselled on the importance of diet, exercise, and its role in overall health and mortality. The patient's FH and SH was reviewed, including their home life, tobacco status, and drug and alcohol status.   Last DEXA 2017, due for repeat in 2-5 years, osteopenia.  Mammogram 02/2016, no family history, plan repeat this year. Colonoscopy 2007, rpt in 10 years, 2017.  No family history.. Plan Q3 year cologuard instead. Vaccines: Uptodate with Tdap, flu, PNA , Zoster, prevnar DVE/pap: Not indicated.. S/p TAH hysterectomy.      Hep C: Done

## 2017-01-29 NOTE — Patient Instructions (Addendum)
Work on increasing exercise.. Goal 3-5 times a week for 30-50 min.  Work on decreasing carb in diet.   Call to schedule on your own.  Complete cologuard on your own once received.

## 2017-01-29 NOTE — Assessment & Plan Note (Signed)
Counseld on weight loss and healthy lifestyle to improve co-morbidities and overall health.

## 2017-08-03 ENCOUNTER — Telehealth: Payer: Self-pay | Admitting: Family Medicine

## 2017-08-03 NOTE — Telephone Encounter (Signed)
Tried to contact pt to see if wanted to be seen today but unable to reach pt by phone.

## 2017-08-03 NOTE — Telephone Encounter (Signed)
Pt has appt with Dr Diona Browner on 08/04/17 at 9 AM.

## 2017-08-03 NOTE — Telephone Encounter (Signed)
Patient Name: Danielle Kane DOB: 02-26-1946 Initial Comment Caller states she has a place on her leg is red, and has a knot, some swelling. Nurse Assessment Nurse: Renie Ora, RN, [place]by Dawes Date/Time (Eastern Time): 08/03/2017 8:53:22 AM Confirm and document reason for call. If symptomatic, describe symptoms. ---Caller states she has a knot on her leg, left shin area. States it is red and swollen around the area, about a quarter size. Noticed it on Thursday. Does not remember hitting it a all. Was itching on Saturday only. Does the patient have any new or worsening symptoms? ---Yes Will a triage be completed? ---Yes Related visit to physician within the last 2 weeks? ---No Does the PT have any chronic conditions? (i.e. diabetes, asthma, etc.) ---Yes Is this a behavioral health or substance abuse call? ---No Guidelines Guideline Title Affirmed Question Affirmed Notes Skin Lump or Localized Swelling [1] Swelling is painful to touch AND [2] no fever Final Disposition User See Physician within 24 Hours Renie Ora, RN, [place] Comments Appt scheduled for 9 am tomorrow, 10/2, with Dr. Diona Browner by nurse, Berton Mount. Patient agreed and confirmed. Referrals REFERRED TO PCP OFFICE Caller Disagree/Comply Comply Caller Understands Yes PreDisposition Call Doctor Scheduled for appointment with [facility] 08/04/17 at [place] report for Minette Brine, RN

## 2017-08-04 ENCOUNTER — Encounter: Payer: Self-pay | Admitting: Family Medicine

## 2017-08-04 ENCOUNTER — Ambulatory Visit (INDEPENDENT_AMBULATORY_CARE_PROVIDER_SITE_OTHER): Payer: Medicare HMO | Admitting: Family Medicine

## 2017-08-04 VITALS — BP 142/98 | HR 87 | Temp 97.9°F | Wt 197.5 lb

## 2017-08-04 DIAGNOSIS — L02416 Cutaneous abscess of left lower limb: Secondary | ICD-10-CM

## 2017-08-04 MED ORDER — CEPHALEXIN 500 MG PO CAPS: 500.0000 mg | ORAL_CAPSULE | Freq: Three times a day (TID) | ORAL | 0 refills | Status: DC

## 2017-08-04 NOTE — Patient Instructions (Addendum)
Warm compresses 3-4 times daily.  Complete 7 days of antibiotics.  Call if fever, not keeping down antibiotics, redness spreading beyond line.  If not improving in next 48-72 hours.. Follow up for re-eval and possible incision and drainage.

## 2017-08-04 NOTE — Progress Notes (Signed)
   Subjective:    Patient ID: Danielle Kane, female    DOB: 01/23/1946, 71 y.o.   MRN: [id number]  HPI  71 year old female presents for new onset mass in left leg x 5 days. She noted red sore area on left anterior leg. Warm.  No discharge.  No known injury.  No change in size.Marland Kitchen Noted at size of nickel.   No associated symptoms.. No fever, no flu like symptoms.  She has treated with peroxide, neosporin etc. No improvement.   No history of MRSA, no boils in personal or family history. No health care exposure.  BP elevated from stress and rushing here today. BP Readings from Last 3 Encounters:  08/04/17 (!) 142/98  01/29/17 138/80  01/22/16 138/88  Blood pressure (!) 142/98, pulse 87, temperature 97.9 F (36.6 C), temperature source Oral, weight 197 lb 8 oz (89.6 kg), SpO2 97 %..   Review of Systems  Constitutional: Negative for fatigue and fever.  HENT: Negative for ear pain.   Eyes: Negative for pain.  Respiratory: Negative for chest tightness and shortness of breath.   Cardiovascular: Negative for chest pain, palpitations and leg swelling.  Gastrointestinal: Negative for abdominal pain.  Genitourinary: Negative for dysuria.       Objective:   Physical Exam  Constitutional: Vital signs are normal. She appears well-developed and well-nourished. She is cooperative.  Non-toxic appearance. She does not appear ill. No distress.  HENT:  Head: Normocephalic.  Right Ear: Hearing, tympanic membrane, external ear and ear canal normal. Tympanic membrane is not erythematous, not retracted and not bulging.  Left Ear: Hearing, tympanic membrane, external ear and ear canal normal. Tympanic membrane is not erythematous, not retracted and not bulging.  Nose: No mucosal edema or rhinorrhea. Right sinus exhibits no maxillary sinus tenderness and no frontal sinus tenderness. Left sinus exhibits no maxillary sinus tenderness and no frontal sinus tenderness.  Mouth/Throat: Uvula is midline,  oropharynx is clear and moist and mucous membranes are normal.  Eyes: Pupils are equal, round, and reactive to light. Conjunctivae, EOM and lids are normal. Lids are everted and swept, no foreign bodies found.  Neck: Trachea normal and normal range of motion. Neck supple. Carotid bruit is not present. No thyroid mass and no thyromegaly present.  Cardiovascular: Normal rate, regular rhythm, S1 normal, S2 normal, normal heart sounds, intact distal pulses and normal pulses.  Exam reveals no gallop and no friction rub.   No murmur heard. Pulmonary/Chest: Effort normal and breath sounds normal. No tachypnea. No respiratory distress. She has no decreased breath sounds. She has no wheezes. She has no rhonchi. She has no rales.  Abdominal: Soft. Normal appearance and bowel sounds are normal. There is no tenderness.  Neurological: She is alert.  Skin: Skin is warm, dry and intact. No rash noted.     Left anterior leg erythematous warm nickel size lesion with slight surrounding erythema.. With central pore, slight fluctuance, associated with surrounding edema  Psychiatric: Her speech is normal and behavior is normal. Judgment and thought content normal. Her mood appears not anxious. Cognition and memory are normal. She does not exhibit a depressed mood.          Assessment & Plan:

## 2017-08-04 NOTE — Assessment & Plan Note (Signed)
No clear indication for I and D, minimal fluctuance.  Limited risk of MRSA.  Treat with warm compresses and oral antibiotics. Follow up if not improving in 48 hours.

## 2017-08-07 ENCOUNTER — Encounter: Payer: Self-pay | Admitting: Family Medicine

## 2017-08-07 ENCOUNTER — Ambulatory Visit (INDEPENDENT_AMBULATORY_CARE_PROVIDER_SITE_OTHER): Payer: Medicare HMO | Admitting: Family Medicine

## 2017-08-07 VITALS — BP 130/86 | HR 81 | Temp 97.7°F | Ht 61.75 in | Wt 197.5 lb

## 2017-08-07 DIAGNOSIS — L02416 Cutaneous abscess of left lower limb: Secondary | ICD-10-CM | POA: Diagnosis not present

## 2017-08-07 MED ORDER — DOXYCYCLINE HYCLATE 100 MG PO TABS: 100.0000 mg | ORAL_TABLET | Freq: Two times a day (BID) | ORAL | 0 refills | Status: DC

## 2017-08-07 NOTE — Assessment & Plan Note (Signed)
I and D performed.. Given minimal pus drained.. Will change antibiotics to doxy x 10 days.

## 2017-08-07 NOTE — Addendum Note (Signed)
Addended by: Eliezer Lofts E on: 08/07/2017 12:15 PM   Modules accepted: Orders

## 2017-08-07 NOTE — Addendum Note (Signed)
Addended by: Carter Kitten on: 08/07/2017 12:23 PM   Modules accepted: Orders

## 2017-08-07 NOTE — Progress Notes (Addendum)
   Subjective:    Patient ID: Danielle Kane, female    DOB: [birth year], 71 y.o.   MRN: [id number]  HPI   71 year old female pt seen on 10/2 for left lower leg abcess. Treated with warm compresses and keflex 500 mg TID. Description of lesion on 10/2:Left anterior leg erythematous warm nickel size lesion with slight surrounding erythema.. With central pore, slight fluctuance, associated with surrounding edema  She is on day 3/7 of antibiotics.  Today she reports  Minimal change in leg lesion, no redness spreading. No increase in pain, slightly less. She has been doing warm compresses.  No fever. No pain in knee or ankle  Blood pressure 130/86, pulse 81, temperature 97.7 F (36.5 C), temperature source Oral, height 5' 1.75" (1.568 m), weight 197 lb 8 oz (89.6 kg).  Review of Systems     Objective:   Physical Exam  Constitutional: Vital signs are normal. She appears well-developed and well-nourished. She is cooperative.  Non-toxic appearance. She does not appear ill. No distress.  HENT:  Head: Normocephalic.  Right Ear: Hearing, tympanic membrane, external ear and ear canal normal. Tympanic membrane is not erythematous, not retracted and not bulging.  Left Ear: Hearing, tympanic membrane, external ear and ear canal normal. Tympanic membrane is not erythematous, not retracted and not bulging.  Nose: No mucosal edema or rhinorrhea. Right sinus exhibits no maxillary sinus tenderness and no frontal sinus tenderness. Left sinus exhibits no maxillary sinus tenderness and no frontal sinus tenderness.  Mouth/Throat: Uvula is midline, oropharynx is clear and moist and mucous membranes are normal.  Eyes: Pupils are equal, round, and reactive to light. Conjunctivae, EOM and lids are normal. Lids are everted and swept, no foreign bodies found.  Neck: Trachea normal and normal range of motion. Neck supple. Carotid bruit is not present. No thyroid mass and no thyromegaly present.  Cardiovascular: Normal  rate, regular rhythm, S1 normal, S2 normal, normal heart sounds, intact distal pulses and normal pulses.  Exam reveals no gallop and no friction rub.   No murmur heard. Pulmonary/Chest: Effort normal and breath sounds normal. No tachypnea. No respiratory distress. She has no decreased breath sounds. She has no wheezes. She has no rhonchi. She has no rales.  Abdominal: Soft. Normal appearance and bowel sounds are normal. There is no tenderness.  Neurological: She is alert.  Skin: Skin is warm, dry and intact. No rash noted.     Left anterior leg erythematous warm 2.5 3 cm lesion centrally with slight less surrounding erythema.. With central pore, increased fluctuance, associated with  Less surrounding edema now  Psychiatric: Her speech is normal and behavior is normal. Judgment and thought content normal. Her mood appears not anxious. Cognition and memory are normal. She does not exhibit a depressed mood.          Assessment & Plan:  Procedure I&D  Meds, vitals, and allergies reviewed.   Indication: suspect abscess  Pt complaints of: erythema, pain, swelling  Location: left anterior leg  Verbal informed consent obtained.  Pt aware of risks not limited to but including infection, bleeding, damage to near by organs.  Prep: etoh/betadine  Anesthesia: 2%lidocaine with epi, good effect  Incision made with #11 blade  Would explored, no loculations, minimal pus drained.. Culture sent.  Wound packed with small amount iodoform gauze  Tolerated well  Routine postprocedure instructions d/w pt- remove packing in 24-48h, keep area clean and bandaged, follow up if concerns/spreading erythema/pain.

## 2017-08-07 NOTE — Patient Instructions (Signed)
Keep area clean/ irrigate with warm soapy water twice dialy. If packing placed remove in 24 hours.  Continue antibiotics.  Call if redness spreading, new fever or increased pain.

## 2017-08-10 LAB — WOUND CULTURE
MICRO NUMBER: 81110582
RESULT:: NO GROWTH
SPECIMEN QUALITY: ADEQUATE

## 2017-09-03 ENCOUNTER — Ambulatory Visit: Payer: Medicare HMO | Admitting: Family Medicine

## 2017-09-07 DIAGNOSIS — R69 Illness, unspecified: Secondary | ICD-10-CM | POA: Diagnosis not present

## 2017-09-10 DIAGNOSIS — D1801 Hemangioma of skin and subcutaneous tissue: Secondary | ICD-10-CM | POA: Diagnosis not present

## 2017-09-10 DIAGNOSIS — L814 Other melanin hyperpigmentation: Secondary | ICD-10-CM | POA: Diagnosis not present

## 2017-09-10 DIAGNOSIS — C44619 Basal cell carcinoma of skin of left upper limb, including shoulder: Secondary | ICD-10-CM | POA: Diagnosis not present

## 2017-09-10 DIAGNOSIS — Z85828 Personal history of other malignant neoplasm of skin: Secondary | ICD-10-CM | POA: Diagnosis not present

## 2017-09-10 DIAGNOSIS — L821 Other seborrheic keratosis: Secondary | ICD-10-CM | POA: Diagnosis not present

## 2017-09-10 DIAGNOSIS — D2371 Other benign neoplasm of skin of right lower limb, including hip: Secondary | ICD-10-CM | POA: Diagnosis not present

## 2017-09-10 DIAGNOSIS — L905 Scar conditions and fibrosis of skin: Secondary | ICD-10-CM | POA: Diagnosis not present

## 2017-09-10 DIAGNOSIS — L570 Actinic keratosis: Secondary | ICD-10-CM | POA: Diagnosis not present

## 2018-01-07 DIAGNOSIS — H524 Presbyopia: Secondary | ICD-10-CM | POA: Diagnosis not present

## 2018-01-07 DIAGNOSIS — H5015 Alternating exotropia: Secondary | ICD-10-CM | POA: Diagnosis not present

## 2018-01-07 DIAGNOSIS — H5213 Myopia, bilateral: Secondary | ICD-10-CM | POA: Diagnosis not present

## 2018-01-07 DIAGNOSIS — H52223 Regular astigmatism, bilateral: Secondary | ICD-10-CM | POA: Diagnosis not present

## 2018-01-07 DIAGNOSIS — H25813 Combined forms of age-related cataract, bilateral: Secondary | ICD-10-CM | POA: Diagnosis not present

## 2018-01-21 ENCOUNTER — Encounter: Payer: Self-pay | Admitting: Family Medicine

## 2018-01-21 ENCOUNTER — Ambulatory Visit (INDEPENDENT_AMBULATORY_CARE_PROVIDER_SITE_OTHER): Payer: Medicare HMO | Admitting: Family Medicine

## 2018-01-21 DIAGNOSIS — B029 Zoster without complications: Secondary | ICD-10-CM | POA: Diagnosis not present

## 2018-01-21 MED ORDER — VALACYCLOVIR HCL 1 G PO TABS: 1000.0000 mg | ORAL_TABLET | Freq: Three times a day (TID) | ORAL | 0 refills | Status: DC

## 2018-01-21 NOTE — Progress Notes (Signed)
Rash near the R eye.  Burning pain initially.  Larger area in the last few days.  Was itchy.  No FCNAVD.  No vision changes.  She has some blisters initially.  No R sided sx.  No other rash anywhere else now.  No eye pain.  She had a similar episode a few years ago, presumed shingles.    Meds, vitals, and allergies reviewed.   ROS: Per HPI unless specifically indicated in ROS section   nad ncat except for L facial rash inferior to L eye, typical for shingles. No R sided sx.  TM wnl B Nasal and OP exam wnl No ocular lesions or discharge noted.  EOMI

## 2018-01-21 NOTE — Patient Instructions (Signed)
Presumed shingles.  Start valtrex.   Update [place] as needed.  Please update your eye doctor.   If any eye symptoms, then call the eye doctor immediately.  Take care.  Glad to see you.

## 2018-01-22 NOTE — Assessment & Plan Note (Signed)
She likely has shingles.  She likely has a milder case because of previous vaccination.  Discussed with patient about options and rationale for treatment.  Start Valtrex 1 g 3 times a day.  Routine cautions given.  I want her to notify her eye clinic.  If she has any ocular symptoms at all that I want her to follow-up with eye clinic.  She understood.  I appreciate the help of all involved.

## 2018-02-02 ENCOUNTER — Ambulatory Visit (INDEPENDENT_AMBULATORY_CARE_PROVIDER_SITE_OTHER): Payer: Medicare HMO

## 2018-02-02 ENCOUNTER — Telehealth: Payer: Self-pay | Admitting: Family Medicine

## 2018-02-02 VITALS — BP 142/86 | HR 89 | Temp 98.6°F | Ht 61.5 in | Wt 193.2 lb

## 2018-02-02 DIAGNOSIS — M8580 Other specified disorders of bone density and structure, unspecified site: Secondary | ICD-10-CM | POA: Diagnosis not present

## 2018-02-02 DIAGNOSIS — I10 Essential (primary) hypertension: Secondary | ICD-10-CM | POA: Diagnosis not present

## 2018-02-02 DIAGNOSIS — Z0000 Encounter for general adult medical examination without abnormal findings: Secondary | ICD-10-CM

## 2018-02-02 LAB — COMPREHENSIVE METABOLIC PANEL
ALBUMIN: 3.7 g/dL (ref 3.5–5.2)
ALK PHOS: 56 U/L (ref 39–117)
ALT: 15 U/L (ref 0–35)
AST: 16 U/L (ref 0–37)
BILIRUBIN TOTAL: 0.9 mg/dL (ref 0.2–1.2)
BUN: 17 mg/dL (ref 6–23)
CO2: 30 mEq/L (ref 19–32)
CREATININE: 0.91 mg/dL (ref 0.40–1.20)
Calcium: 9.6 mg/dL (ref 8.4–10.5)
Chloride: 106 mEq/L (ref 96–112)
GFR: 64.58 mL/min (ref >60.00)
GLUCOSE: 91 mg/dL (ref 70–99)
POTASSIUM: 4.6 meq/L (ref 3.5–5.1)
SODIUM: 141 meq/L (ref 135–145)
Total Protein: 7.3 g/dL (ref 6.0–8.3)

## 2018-02-02 LAB — LIPID PANEL
CHOLESTEROL: 136 mg/dL (ref 0–200)
HDL: 42.3 mg/dL (ref >39.00)
LDL Cholesterol: 80 mg/dL (ref 0–99)
NONHDL: 93.81
Total CHOL/HDL Ratio: 3
Triglycerides: 70 mg/dL (ref 0.0–149.0)
VLDL: 14 mg/dL (ref 0.0–40.0)

## 2018-02-02 LAB — VITAMIN D 25 HYDROXY (VIT D DEFICIENCY, FRACTURES): VITD: 35.71 ng/mL (ref 30.00–100.00)

## 2018-02-02 NOTE — Progress Notes (Signed)
I reviewed health advisor's note, was available for consultation, and agree with documentation and plan.  

## 2018-02-02 NOTE — Patient Instructions (Addendum)
Danielle Kane , Thank you for taking time to come for your Medicare Wellness Visit. I appreciate your ongoing commitment to your health goals. Please review the following plan we discussed and let me know if I can assist you in the future.   These are the goals we discussed: Goals    . Follow up with Primary Care Provider     [place] 02/02/2018, I will continue to take medications as prescribed and to keep appointments with PCP as scheduled.        This is a list of the screening recommended for you and due dates:  Health Maintenance  Topic Date Due  . Cologuard (Stool DNA test)  11/02/2018*  . Mammogram  02/20/2018  . Flu Shot  06/03/2018  . Tetanus Vaccine  11/16/2021  . DEXA scan (bone density measurement)  Completed  .  Hepatitis C: One time screening is recommended by Center for Disease Control  (CDC) for  adults born from [birth year] through 1965.   Completed  . Pneumonia vaccines  Completed  *Topic was postponed. The date shown is not the original due date.    Preventive Care for Adults  A healthy lifestyle and preventive care can promote health and wellness. Preventive health guidelines for adults include the following key practices.  . A routine yearly physical is a good way to check with your health care provider about your health and preventive screening. It is a chance to share any concerns and updates on your health and to receive a thorough exam.  . Visit your dentist for a routine exam and preventive care every 6 months. Brush your teeth twice a day and floss once a day. Good oral hygiene prevents tooth decay and gum disease.  . The frequency of eye exams is based on your age, health, family medical history, use  of contact lenses, and other factors. Follow your health care provider's recommendations for frequency of eye exams.  . Eat a healthy diet. Foods like vegetables, fruits, whole grains, low-fat dairy products, and lean protein foods contain the nutrients you need without  too many calories. Decrease your intake of foods high in solid fats, added sugars, and salt. Eat the right amount of calories for you. Get information about a proper diet from your health care provider, if necessary.  . Regular physical exercise is one of the most important things you can do for your health. Most adults should get at least 150 minutes of moderate-intensity exercise (any activity that increases your heart rate and causes you to sweat) each week. In addition, most adults need muscle-strengthening exercises on 2 or more days a week.  Silver Sneakers may be a benefit available to you. To determine eligibility, you may visit the website: www.silversneakers.com or contact program at 579 637 7191 Mon-Fri between 8AM-8PM.   . Maintain a healthy weight. The body mass index (BMI) is a screening tool to identify possible weight problems. It provides an estimate of body fat based on height and weight. Your health care provider can find your BMI and can help you achieve or maintain a healthy weight.   For adults 20 years and older: ? A BMI below 18.5 is considered underweight. ? A BMI of 18.5 to 24.9 is normal. ? A BMI of 25 to 29.9 is considered overweight. ? A BMI of 30 and above is considered obese.   . Maintain normal blood lipids and cholesterol levels by exercising and minimizing your intake of saturated fat. Eat a balanced diet with  plenty of fruit and vegetables. Blood tests for lipids and cholesterol should begin at age 61 and be repeated every 5 years. If your lipid or cholesterol levels are high, you are over 50, or you are at high risk for heart disease, you may need your cholesterol levels checked more frequently. Ongoing high lipid and cholesterol levels should be treated with medicines if diet and exercise are not working.  . If you smoke, find out from your health care provider how to quit. If you do not use tobacco, please do not start.  . If you choose to drink alcohol,  please do not consume more than 2 drinks per day. One drink is considered to be 12 ounces (355 mL) of beer, 5 ounces (148 mL) of wine, or 1.5 ounces (44 mL) of liquor.  . If you are 32-75 years old, ask your health care provider if you should take aspirin to prevent strokes.  . Use sunscreen. Apply sunscreen liberally and repeatedly throughout the day. You should seek shade when your shadow is shorter than you. Protect yourself by wearing long sleeves, pants, a wide-brimmed hat, and sunglasses year round, whenever you are outdoors.  . Once a month, do a whole body skin exam, using a mirror to look at the skin on your back. Tell your health care provider of new moles, moles that have irregular borders, moles that are larger than a pencil eraser, or moles that have changed in shape or color.

## 2018-02-02 NOTE — Progress Notes (Signed)
PCP notes:   Health maintenance:  Colon cancer screening - addressed  Abnormal screenings:   Hearing - failed  Hearing Screening   125Hz  250Hz  500Hz  1000Hz  2000Hz  3000Hz  4000Hz  6000Hz  8000Hz   Right ear:   40 40 40  40    Left ear:   0 0 40  40     Patient concerns:   None  Nurse concerns:  None  Next PCP appt:   02/12/18 @ 0915

## 2018-02-02 NOTE — Telephone Encounter (Signed)
-----   Message from Eustace Pen, LPN sent at 03/04/8412 12:17 PM EDT ----- Regarding: Labs 4/2 Lab orders needed. Thank you.  Insurance:  [employer]

## 2018-02-02 NOTE — Progress Notes (Signed)
Subjective:   Danielle Kane is a 72 y.o. female who presents for Medicare Annual (Subsequent) preventive examination.  Review of Systems:  N/A Cardiac Risk Factors include: advanced age (>10men, >31 women);obesity (BMI >30kg/m2);hypertension     Objective:     Vitals: BP (!) 142/86 (BP Location: Right Arm, Patient Position: Sitting, Cuff Size: Normal)   Pulse 89   Temp 98.6 F (37 C) (Oral)   Ht 5' 1.5" (1.562 m) Comment: no shoes  Wt 193 lb 4 oz (87.7 kg)   SpO2 96%   BMI 35.92 kg/m   Body mass index is 35.92 kg/m.  Advanced Directives 02/02/2018 01/29/2017 07/13/2015  Does Patient Have a Medical Advance Directive? Yes Yes Yes  Type of Paramedic of [place];Living will [place];Living will Living will;Healthcare Power of Attorney  Does patient want to make changes to medical advance directive? - No - Patient declined -  Copy of [place] in Chart? No - copy requested No - copy requested No - copy requested    Tobacco Social History   Tobacco Use  Smoking Status Never Smoker  Smokeless Tobacco Never Used     Counseling given: No   Clinical Intake:  Pre-visit preparation completed: Yes  Pain : No/denies pain Pain Score: 0-No pain     Nutritional Status: BMI > 30  Obese Nutritional Risks: None Diabetes: No  How often do you need to have someone help you when you read instructions, pamphlets, or other written materials from your doctor or pharmacy?: 1 - Never What is the last grade level you completed in school?: 12th grade  Interpreter Needed?: No  Comments: pt is a widow and lives alone Information entered by :: LPinson, LPN  Past Medical History:  Diagnosis Date  . GERD (gastroesophageal reflux disease)    Past Surgical History:  Procedure Laterality Date  . ABDOMINAL HYSTERECTOMY     Family History  Problem Relation Age of Onset  . Heart failure Mother   . Stroke Father   . Kidney  disease Maternal Grandmother   . Coronary artery disease Maternal Grandfather   . Heart attack Maternal Grandfather   . Osteoporosis Paternal Grandfather    Social History   Socioeconomic History  . Marital status: Widowed    Spouse name: Not on file  . Number of children: Not on file  . Years of education: Not on file  . Highest education level: Not on file  Occupational History  . Occupation: Freight forwarder  Social Needs  . Financial resource strain: Not on file  . Food insecurity:    Worry: Not on file    Inability: Not on file  . Transportation needs:    Medical: Not on file    Non-medical: Not on file  Tobacco Use  . Smoking status: Never Smoker  . Smokeless tobacco: Never Used  Substance and Sexual Activity  . Alcohol use: No    Alcohol/week: 0.0 oz  . Drug use: No  . Sexual activity: Not on file  Lifestyle  . Physical activity:    Days per week: Not on file    Minutes per session: Not on file  . Stress: Not on file  Relationships  . Social connections:    Talks on phone: Not on file    Gets together: Not on file    Attends religious service: Not on file    Active member of club or organization: Not on file  Attends meetings of clubs or organizations: Not on file    Relationship status: Not on file  Other Topics Concern  . Not on file  Social History Narrative   Regular exercise-no   Diet fruits and veggies, water    Has living will, [facility] Danielle Kane and Danielle Kane),  full code  (reviewed 2015)    Outpatient Encounter Medications as of 02/02/2018  Medication Sig  . Calcium Carbonate-Vitamin D (CALCIUM 600-D) 600-400 MG-UNIT per tablet Take 1 tablet by mouth daily.    Marland Kitchen esomeprazole (NEXIUM) 20 MG capsule Take 20 mg by mouth daily at 12 noon.  Marland Kitchen ibuprofen (ADVIL,MOTRIN) 200 MG tablet Take 400 mg by mouth every 6 (six) hours as needed for moderate pain.  . Multiple Vitamin (MULTIVITAMIN) tablet Take 1 tablet by mouth daily.    . Simethicone (GAS RELIEF) 180 MG CAPS  Take 180 mg by mouth daily as needed (for bloating).  . [DISCONTINUED] valACYclovir (VALTREX) 1000 MG tablet Take 1 tablet (1,000 mg total) by mouth 3 (three) times daily.   No facility-administered encounter medications on file as of 02/02/2018.     Activities of Daily Living In your present state of health, do you have any difficulty performing the following activities: 02/02/2018  Hearing? N  Vision? N  Difficulty concentrating or making decisions? N  Walking or climbing stairs? N  Dressing or bathing? N  Doing errands, shopping? N  Preparing Food and eating ? N  Using the Toilet? N  In the past six months, have you accidently leaked urine? N  Do you have problems with loss of bowel control? N  Managing your Medications? N  Managing your Finances? N  Housekeeping or managing your Housekeeping? N  Some recent data might be hidden    Patient Care Team: Danielle Sanders, MD as PCP - General    Assessment:   This is a routine wellness examination for [name].   Hearing Screening   125Hz  250Hz  500Hz  1000Hz  2000Hz  3000Hz  4000Hz  6000Hz  8000Hz   Right ear:   40 40 40  40    Left ear:   0 0 40  40    Vision Screening Comments: Last vision exam in March 2019 with Dr. Einar Gip    Exercise Activities and Dietary recommendations Current Exercise Habits: Home exercise routine, Type of exercise: walking, Time (Minutes): 20, Frequency (Times/Week): 5, Weekly Exercise (Minutes/Week): 100, Intensity: Mild, Exercise limited by: None identified  Goals    . Follow up with Primary Care Provider     Star[place] 02/02/2018, I will continue to take medications as prescribed and to keep appointments with PCP as scheduled.        Fall Risk Fall Risk  02/02/2018 01/29/2017 01/22/2016 02/14/2014 12/21/2012  Falls in the past year? No No No No No   Depression Screen PHQ 2/9 Scores 02/02/2018 01/29/2017 01/22/2016 07/20/2015  PHQ - 2 Score 0 0 0 0  PHQ- 9 Score 0 - - -     Cognitive Function MMSE - Mini  Mental State Exam 02/02/2018  Orientation to time 5  Orientation to Place 5  Registration 3  Attention/ Calculation 0  Recall 3  Language- name 2 objects 0  Language- repeat 1  Language- follow 3 step command 3  Language- read & follow direction 0  Write a sentence 0  Copy design 0  Total score 20     PLEASE NOTE: A Mini-Cog screen was completed. Maximum score is 20. A value of 0 denotes this  part of [place] MMSE was not completed or the patient failed this part of the Mini-Cog screening.   Mini-Cog Screening Orientation to Time - Max 5 pts Orientation to Place - Max 5 pts Registration - Max 3 pts Recall - Max 3 pts Language Repeat - Max 1 pts Language Follow 3 Step Command - Max 3 pts     Immunization History  Administered Date(s) Administered  . Influenza Split 08/28/2011, 08/26/2012  . Influenza Whole 08/17/2007, 07/25/2008, 08/02/2009, 07/15/2010  . Influenza, High Dose Seasonal PF 09/07/2017  . Influenza,inj,Quad PF,6+ Mos 08/30/2013, 08/11/2014, 07/20/2015, 08/29/2016  . Pneumococcal Conjugate-13 02/14/2014  . Pneumococcal Polysaccharide-23 08/28/2011  . Td 04/03/2000  . Tdap 11/17/2011  . Zoster 11/17/2011    Screening Tests Health Maintenance  Topic Date Due  . Fecal DNA (Cologuard)  11/02/2018 (Originally 01/14/1996)  . MAMMOGRAM  02/20/2018  . INFLUENZA VACCINE  06/03/2018  . TETANUS/TDAP  11/16/2021  . DEXA SCAN  Completed  . Hepatitis C Screening  Completed  . PNA vac Low Risk Adult  Completed      Plan:     I have personally reviewed, addressed, and noted the following in the patient's chart:  A. Medical and social history B. Use of alcohol, tobacco or illicit drugs  C. Current medications and supplements D. Functional ability and status E.  Nutritional status F.  Physical activity G. Advance directives H. List of other physicians I.  Hospitalizations, surgeries, and ER visits in previous 12 months J.  [place] to include hearing,  vision, cognitive, depression L. Referrals and appointments - none  In addition, I have reviewed and discussed with patient certain preventive protocols, quality metrics, and best practice recommendations. A written personalized care plan for preventive services as well as general preventive health recommendations were provided to patient.  See attached scanned questionnaire for additional information.   Signed,   Lindell Noe, MHA, BS, LPN Health Coach

## 2018-02-05 ENCOUNTER — Encounter: Payer: Medicare HMO | Admitting: Family Medicine

## 2018-02-12 ENCOUNTER — Other Ambulatory Visit: Payer: Self-pay

## 2018-02-12 ENCOUNTER — Ambulatory Visit (INDEPENDENT_AMBULATORY_CARE_PROVIDER_SITE_OTHER): Payer: Medicare HMO | Admitting: Family Medicine

## 2018-02-12 ENCOUNTER — Encounter: Payer: Self-pay | Admitting: Family Medicine

## 2018-02-12 VITALS — BP 142/78 | HR 87 | Temp 97.9°F | Ht 61.5 in | Wt 195.0 lb

## 2018-02-12 DIAGNOSIS — Z0000 Encounter for general adult medical examination without abnormal findings: Secondary | ICD-10-CM | POA: Diagnosis not present

## 2018-02-12 DIAGNOSIS — Z6835 Body mass index (BMI) 35.0-35.9, adult: Secondary | ICD-10-CM

## 2018-02-12 DIAGNOSIS — I10 Essential (primary) hypertension: Secondary | ICD-10-CM

## 2018-02-12 NOTE — Progress Notes (Signed)
Subjective:    Patient ID: Danielle Kane, female    DOB: 1946-08-21, 72 y.o.   MRN: [id number]  HPI The patient presents forcomplete physical and review of chronic health problems.   The patient saw Candis Musa, LPN for medicare wellness. Note reviewed in detail and important notes copied below. Health maintenance: Colon cancer screening - addressed  Abnormal screenings:  Hearing - failed             Hearing Screening   125Hz  250Hz  500Hz  1000Hz  2000Hz  3000Hz  4000Hz  6000Hz  8000Hz   Right ear:   40 40 40  40    Left ear:   0 0 40  40     Patient concerns:  None    Today 02/12/18 Hypertension:  Elevated BP on no med today BP Readings from Last 3 Encounters:  02/12/18 (!) 142/78  02/02/18 (!) 142/86  01/21/18 (!) 150/70  Using medication without problems or lightheadedness:  None Chest pain with exertion: none Edema:none Short of breath:none Average home BPs: not following Other issues: Body mass index is 36.25 kg/m. Wt Readings from Last 3 Encounters:  02/12/18 195 lb (88.5 kg)  02/02/18 193 lb 4 oz (87.7 kg)  01/21/18 196 lb 8 oz (89.1 kg)   Diet: healhty. Eating less.  exercise: walking 20 min daily at work.   GERD well controlled.. unable to wean off 20 mg nexium.  Social History /Family History/Past Medical History reviewed in detail and updated in EMR if needed. Blood pressure (!) 142/78, pulse 87, temperature 97.9 F (36.6 C), temperature source Oral, height 5' 1.5" (1.562 m), weight 195 lb (88.5 kg).   Review of Systems  Constitutional: Negative for fatigue and fever.  HENT: Negative for congestion.   Eyes: Negative for pain.  Respiratory: Negative for cough and shortness of breath.   Cardiovascular: Negative for chest pain, palpitations and leg swelling.  Gastrointestinal: Negative for abdominal pain.  Genitourinary: Negative for dysuria and vaginal bleeding.  Musculoskeletal: Negative for back pain.  Neurological: Negative for  syncope, light-headedness and headaches.  Psychiatric/Behavioral: Negative for dysphoric mood.       Objective:   Physical Exam  Constitutional: Vital signs are normal. She appears well-developed and well-nourished. She is cooperative.  Non-toxic appearance. She does not appear ill. No distress.  HENT:  Head: Normocephalic.  Right Ear: Hearing, tympanic membrane, external ear and ear canal normal.  Left Ear: Hearing, tympanic membrane, external ear and ear canal normal.  Nose: Nose normal.  Eyes: Pupils are equal, round, and reactive to light. Conjunctivae, EOM and lids are normal. Lids are everted and swept, no foreign bodies found.  Neck: Trachea normal and normal range of motion. Neck supple. Carotid bruit is not present. No thyroid mass and no thyromegaly present.  Cardiovascular: Normal rate, regular rhythm, S1 normal, S2 normal, normal heart sounds and intact distal pulses. Exam reveals no gallop.  No murmur heard. Pulmonary/Chest: Effort normal and breath sounds normal. No respiratory distress. She has no wheezes. She has no rhonchi. She has no rales.  Abdominal: Soft. Normal appearance and bowel sounds are normal. She exhibits no distension, no fluid wave, no abdominal bruit and no mass. There is no hepatosplenomegaly. There is no tenderness. There is no rebound, no guarding and no CVA tenderness. No hernia.  Lymphadenopathy:    She has no cervical adenopathy.    She has no axillary adenopathy.  Neurological: She is alert. She has normal strength. No cranial nerve deficit or sensory deficit.  Skin:  Skin is warm, dry and intact. No rash noted.  Psychiatric: Her speech is normal and behavior is normal. Judgment normal. Her mood appears not anxious. Cognition and memory are normal. She does not exhibit a depressed mood.          Assessment & Plan:  The patient's preventative maintenance and recommended screening tests for an annual wellness exam were reviewed in full  today. Brought up to date unless services declined.  Counselled on the importance of diet, exercise, and its role in overall health and mortality. The patient's FH and SH was reviewed, including their home life, tobacco status, and drug and alcohol status.   Last DEXA 2017, due for repeat in 2-5 years, osteopenia.  Mammogram 02/2016, no family history, plan  q 2 years.  Colonoscopy 2007, rpt in 10 years, 2017. No family history.. Plan Q3 year cologuard instead. Vaccines: Uptodate with Tdap, flu, PNA , Zoster, prevnar DVE/pap: Not indicated.. S/p TAH hysterectomy.      Hep C: Done

## 2018-02-12 NOTE — Assessment & Plan Note (Signed)
?   Whit coait HTN.Follow at home. Encouraged exercise, weight loss, healthy eating habits.  May need to start HCTZ if elevated at home as well.

## 2018-02-12 NOTE — Patient Instructions (Addendum)
Get blood pressure cuff: arm.  Follow BP at home daily.. Call in 2 weeks with BP measurements, goal < 140/90. Call to set up mammogram on your own.  Return cologuard ASAP.

## 2018-08-30 DIAGNOSIS — R69 Illness, unspecified: Secondary | ICD-10-CM | POA: Diagnosis not present

## 2018-09-14 DIAGNOSIS — D2272 Melanocytic nevi of left lower limb, including hip: Secondary | ICD-10-CM | POA: Diagnosis not present

## 2018-09-14 DIAGNOSIS — D235 Other benign neoplasm of skin of trunk: Secondary | ICD-10-CM | POA: Diagnosis not present

## 2018-09-14 DIAGNOSIS — Z85828 Personal history of other malignant neoplasm of skin: Secondary | ICD-10-CM | POA: Diagnosis not present

## 2018-09-14 DIAGNOSIS — D225 Melanocytic nevi of trunk: Secondary | ICD-10-CM | POA: Diagnosis not present

## 2018-09-14 DIAGNOSIS — L814 Other melanin hyperpigmentation: Secondary | ICD-10-CM | POA: Diagnosis not present

## 2018-09-14 DIAGNOSIS — L821 Other seborrheic keratosis: Secondary | ICD-10-CM | POA: Diagnosis not present

## 2018-09-14 DIAGNOSIS — D2261 Melanocytic nevi of right upper limb, including shoulder: Secondary | ICD-10-CM | POA: Diagnosis not present

## 2018-09-14 DIAGNOSIS — D2371 Other benign neoplasm of skin of right lower limb, including hip: Secondary | ICD-10-CM | POA: Diagnosis not present

## 2018-09-14 DIAGNOSIS — D2262 Melanocytic nevi of left upper limb, including shoulder: Secondary | ICD-10-CM | POA: Diagnosis not present

## 2018-09-14 DIAGNOSIS — D2271 Melanocytic nevi of right lower limb, including hip: Secondary | ICD-10-CM | POA: Diagnosis not present

## 2018-11-22 DIAGNOSIS — H1032 Unspecified acute conjunctivitis, left eye: Secondary | ICD-10-CM | POA: Diagnosis not present

## 2019-02-10 ENCOUNTER — Ambulatory Visit: Payer: Medicare HMO

## 2019-02-15 ENCOUNTER — Encounter: Payer: Medicare HMO | Admitting: Family Medicine

## 2019-06-27 ENCOUNTER — Telehealth: Payer: Self-pay | Admitting: Family Medicine

## 2019-06-27 DIAGNOSIS — I10 Essential (primary) hypertension: Secondary | ICD-10-CM

## 2019-06-27 NOTE — Telephone Encounter (Signed)
-----   Message from Ellamae Sia sent at 06/21/2019 10:13 AM EDT ----- Regarding: Lab orders for Wednesday, 8.25.20 Patient is scheduled for CPX labs, please order future labs, Thanks , Karna Christmas

## 2019-06-29 ENCOUNTER — Other Ambulatory Visit: Payer: Self-pay

## 2019-06-29 ENCOUNTER — Other Ambulatory Visit (INDEPENDENT_AMBULATORY_CARE_PROVIDER_SITE_OTHER): Payer: Medicare HMO

## 2019-06-29 DIAGNOSIS — I10 Essential (primary) hypertension: Secondary | ICD-10-CM

## 2019-06-29 LAB — COMPREHENSIVE METABOLIC PANEL
ALT: 12 U/L (ref 0–35)
AST: 16 U/L (ref 0–37)
Albumin: 4 g/dL (ref 3.5–5.2)
Alkaline Phosphatase: 50 U/L (ref 39–117)
BUN: 14 mg/dL (ref 6–23)
CO2: 27 mEq/L (ref 19–32)
Calcium: 9.4 mg/dL (ref 8.4–10.5)
Chloride: 106 mEq/L (ref 96–112)
Creatinine, Ser: 0.96 mg/dL (ref 0.40–1.20)
GFR: 56.9 mL/min — ABNORMAL LOW (ref >60.00)
Glucose, Bld: 94 mg/dL (ref 70–99)
Potassium: 4.3 mEq/L (ref 3.5–5.1)
Sodium: 141 mEq/L (ref 135–145)
Total Bilirubin: 0.8 mg/dL (ref 0.2–1.2)
Total Protein: 7.2 g/dL (ref 6.0–8.3)

## 2019-06-29 LAB — LIPID PANEL
Cholesterol: 149 mg/dL (ref 0–200)
HDL: 45.8 mg/dL (ref >39.00)
LDL Cholesterol: 84 mg/dL (ref 0–99)
NonHDL: 103.49
Total CHOL/HDL Ratio: 3
Triglycerides: 95 mg/dL (ref 0.0–149.0)
VLDL: 19 mg/dL (ref 0.0–40.0)

## 2019-06-29 LAB — HEMOGLOBIN A1C: Hgb A1c MFr Bld: 5.6 % (ref 4.6–6.5)

## 2019-07-04 ENCOUNTER — Ambulatory Visit: Payer: Medicare HMO

## 2019-07-08 ENCOUNTER — Ambulatory Visit (INDEPENDENT_AMBULATORY_CARE_PROVIDER_SITE_OTHER): Payer: Medicare HMO | Admitting: Family Medicine

## 2019-07-08 ENCOUNTER — Other Ambulatory Visit: Payer: Self-pay

## 2019-07-08 ENCOUNTER — Encounter: Payer: Self-pay | Admitting: Family Medicine

## 2019-07-08 ENCOUNTER — Encounter: Payer: Medicare HMO | Admitting: Family Medicine

## 2019-07-08 VITALS — BP 144/92 | HR 90 | Temp 98.6°F | Ht 61.5 in | Wt 194.8 lb

## 2019-07-08 DIAGNOSIS — R002 Palpitations: Secondary | ICD-10-CM | POA: Diagnosis not present

## 2019-07-08 DIAGNOSIS — Z0000 Encounter for general adult medical examination without abnormal findings: Secondary | ICD-10-CM | POA: Diagnosis not present

## 2019-07-08 DIAGNOSIS — Z23 Encounter for immunization: Secondary | ICD-10-CM

## 2019-07-08 DIAGNOSIS — Z6835 Body mass index (BMI) 35.0-35.9, adult: Secondary | ICD-10-CM | POA: Diagnosis not present

## 2019-07-08 DIAGNOSIS — I10 Essential (primary) hypertension: Secondary | ICD-10-CM | POA: Diagnosis not present

## 2019-07-08 MED ORDER — HYDROCHLOROTHIAZIDE 25 MG PO TABS: 25.0000 mg | ORAL_TABLET | Freq: Every day | ORAL | 11 refills | Status: DC

## 2019-07-08 NOTE — Progress Notes (Signed)
Chief Complaint  Patient presents with  . Medicare Wellness    History of Present Illness: HPI  The patient presents for annual medicare wellness, complete physical and review of chronic health problems. He/She also has the following acute concerns today:  I have personally reviewed the Medicare Annual Wellness questionnaire and have noted 1. The patient's medical and social history 2. Their use of alcohol, tobacco or illicit drugs 3. Their current medications and supplements 4. The patient's functional ability including ADL's, fall risks, home safety risks and hearing or visual             impairment. 5. Diet and physical activities 6. Evidence for depression or mood disorders 7.         Updated provider list Cognitive evaluation was performed and recorded on pt medicare questionnaire form. The patients weight, height, BMI and visual acuity have been recorded in the chart  I have made referrals, counseling and provided education to the patient based review of the above and I have provided the pt with a written personalized care plan for preventive services.   Documentation of this information was scanned into the electronic record under the media tab.   Advance directives and end of life planning reviewed in detail with patient and documented in EMR. Patient given handout on advance care directives if needed. HCPOA and living will updated if needed. Fall Risk  07/08/2019 02/02/2018 01/29/2017 01/22/2016 02/14/2014  Falls in the past year? 0 No No No No   Depression screen [facility] 2/9 07/08/2019 02/02/2018 01/29/2017  Decreased Interest 0 0 0  Down, Depressed, Hopeless 0 0 0  PHQ - 2 Score 0 0 0  Altered sleeping - 0 -  Tired, decreased energy - 0 -  Change in appetite - 0 -  Feeling bad or failure about yourself  - 0 -  Trouble concentrating - 0 -  Moving slowly or fidgety/restless - 0 -  Suicidal thoughts - 0 -  PHQ-9 Score - 0 -  Difficult doing work/chores - Not difficult at all -    Hearing Screening   Method: Audiometry   125Hz  250Hz  500Hz  1000Hz  2000Hz  3000Hz  4000Hz  6000Hz  8000Hz   Right ear:   20 20 20  20     Left ear:   0 0 20  20      Visual Acuity Screening   Right eye Left eye Both eyes  Without correction: 20/30 20/30 20/30   With correction:      Hypertension:   Borderline control in office on no medication. BP Readings from Last 3 Encounters:  07/08/19 (!) 144/92  02/12/18 (!) 142/78  02/02/18 (!) 142/86  Using medication without problems or lightheadedness: none Chest pain with exertion:none Edema:none Short of breath:none Average home BPs: not checking. Other issues: Every couple of days she notes palpitations for a moment.. then it resolves.. she has noted this in the last 2-3 weeks.   Wt Readings from Last 3 Encounters:  07/08/19 194 lb 12 oz (88.3 kg)  02/12/18 195 lb (88.5 kg)  02/02/18 193 lb 4 oz (87.7 kg)    Reviewed   LDL  Is at goal < 100 .  COVID 19 screen No recent travel or known exposure to COVID19 The patient denies respiratory symptoms of COVID 19 at this time.  The importance of social distancing was discussed today.   Review of Systems  Constitutional: Negative for chills and fever.  HENT: Negative for congestion and ear pain.   Eyes: Negative for pain  and redness.  Respiratory: Negative for cough and shortness of breath.   Cardiovascular: Positive for palpitations. Negative for chest pain and leg swelling.  Gastrointestinal: Negative for abdominal pain, blood in stool, constipation, diarrhea, nausea and vomiting.  Genitourinary: Negative for dysuria.  Musculoskeletal: Negative for falls and myalgias.  Skin: Negative for rash.  Neurological: Negative for dizziness.  Psychiatric/Behavioral: Negative for depression. The patient is not nervous/anxious.       Past Medical History:  Diagnosis Date  . GERD (gastroesophageal reflux disease)     reports that she has never smoked. She has never used smokeless tobacco. She  reports that she does not drink alcohol or use drugs.   Current Outpatient Medications:  .  Calcium Carbonate-Vitamin D (CALCIUM 600-D) 600-400 MG-UNIT per tablet, Take 1 tablet by mouth daily.  , Disp: , Rfl:  .  esomeprazole (NEXIUM) 20 MG capsule, Take 20 mg by mouth daily at 12 noon., Disp: , Rfl:  .  ibuprofen (ADVIL,MOTRIN) 200 MG tablet, Take 400 mg by mouth every 6 (six) hours as needed for moderate pain., Disp: , Rfl:  .  Multiple Vitamin (MULTIVITAMIN) tablet, Take 1 tablet by mouth daily.  , Disp: , Rfl:  .  Simethicone (GAS RELIEF) 180 MG CAPS, Take 180 mg by mouth daily as needed (for bloating)., Disp: , Rfl:    Observations/Objective: Blood pressure (!) 144/92, pulse 90, temperature 98.6 F (37 C), temperature source Temporal, height 5' 1.5" (1.562 m), weight 194 lb 12 oz (88.3 kg), SpO2 98 %.  Physical Exam Constitutional:      General: She is not in acute distress.    Appearance: Normal appearance. She is well-developed. She is obese. She is not ill-appearing or toxic-appearing.  HENT:     Head: Normocephalic.     Right Ear: Hearing, tympanic membrane, ear canal and external ear normal. Tympanic membrane is not erythematous, retracted or bulging.     Left Ear: Hearing, tympanic membrane, ear canal and external ear normal. Tympanic membrane is not erythematous, retracted or bulging.     Nose: Nose normal. No mucosal edema or rhinorrhea.     Right Sinus: No maxillary sinus tenderness or frontal sinus tenderness.     Left Sinus: No maxillary sinus tenderness or frontal sinus tenderness.     Mouth/Throat:     Pharynx: Uvula midline.  Eyes:     General: Lids are normal. Lids are everted, no foreign bodies appreciated.     Conjunctiva/sclera: Conjunctivae normal.     Pupils: Pupils are equal, round, and reactive to light.  Neck:     Musculoskeletal: Normal range of motion and neck supple.     Thyroid: No thyroid mass or thyromegaly.     Vascular: No carotid bruit.      Trachea: Trachea normal.  Cardiovascular:     Rate and Rhythm: Normal rate and regular rhythm.     Pulses: Normal pulses.     Heart sounds: Normal heart sounds, S1 normal and S2 normal. No murmur. No friction rub. No gallop.   Pulmonary:     Effort: Pulmonary effort is normal. No tachypnea or respiratory distress.     Breath sounds: Normal breath sounds. No decreased breath sounds, wheezing, rhonchi or rales.  Abdominal:     General: Bowel sounds are normal. There is no distension or abdominal bruit.     Palpations: Abdomen is soft. There is no fluid wave or mass.     Tenderness: There is no abdominal tenderness. There is  no guarding or rebound.     Hernia: No hernia is present.  Lymphadenopathy:     Cervical: No cervical adenopathy.  Skin:    General: Skin is warm and dry.     Findings: No rash.  Neurological:     Mental Status: She is alert.     Cranial Nerves: No cranial nerve deficit.     Sensory: No sensory deficit.  Psychiatric:        Mood and Affect: Mood is not anxious or depressed.        Speech: Speech normal.        Behavior: Behavior normal. Behavior is cooperative.        Thought Content: Thought content normal.        Judgment: Judgment normal.      Assessment and Plan The patient's preventative maintenance and recommended screening tests for an annual wellness exam were reviewed in full today. Brought up to date unless services declined.  Counselled on the importance of diet, exercise, and its role in overall health and mortality. The patient's FH and SH was reviewed, including their home life, tobacco status, and drug and alcohol status.   Last DEXA 2017, due for repeat in5 years, osteopenia.  Mammogram4/2017, no family history, plan  q 2 years... due now Colonoscopy 2007, rpt in 10 years, 2017.No family history.. Plan Q3 year cologuard instead... has not returned.. rescent request today. Vaccines: Uptodate with Tdap, flu, PNA , Zoster, prevnar given  flu shot today DVE/pap: Not indicated.. S/p TAH hysterectomy.      Hep C: Done   Palpitation  Check EKG today. EKG: normal EKG, normal sinus rhythm, unchanged from previous tracings.   Stop caffeine. If more frequent contsider further eval with monitor.   Essential hypertension, benign No clear secondary cause, will address risk factors.  Eval for end organ damage with EKG.  Start HCTZ. Encouraged exercise, weight loss, healthy eating habits.   BMI 35.0-35.9,adult Encouraged exercise, weight loss, healthy eating habits.     Eliezer Lofts, MD

## 2019-07-08 NOTE — Assessment & Plan Note (Signed)
Encouraged exercise, weight loss, healthy eating habits. ? ?

## 2019-07-08 NOTE — Assessment & Plan Note (Addendum)
Check EKG today. EKG: normal EKG, normal sinus rhythm, unchanged from previous tracings.   Stop caffeine. If more frequent contsider further eval with monitor.

## 2019-07-08 NOTE — Assessment & Plan Note (Signed)
No clear secondary cause, will address risk factors.  Eval for end organ damage with EKG.  Start HCTZ. Encouraged exercise, weight loss, healthy eating habits.

## 2019-07-08 NOTE — Patient Instructions (Addendum)
Work on   weihgt loss, regular exercise, low salt diet.  Start HCTZ 25 mg daily.  Follow BP at home.Marland Kitchen goal BP < 140/90. Email with BP measurements in 1-2 weeks.  Call to schedule mammogram on your own.  Avoid caffeine.

## 2019-07-24 DIAGNOSIS — Z1211 Encounter for screening for malignant neoplasm of colon: Secondary | ICD-10-CM | POA: Diagnosis not present

## 2019-07-24 DIAGNOSIS — Z1212 Encounter for screening for malignant neoplasm of rectum: Secondary | ICD-10-CM | POA: Diagnosis not present

## 2019-07-29 LAB — COLOGUARD: Cologuard: NEGATIVE

## 2019-08-01 ENCOUNTER — Encounter: Payer: Self-pay | Admitting: Family Medicine

## 2019-08-09 ENCOUNTER — Other Ambulatory Visit: Payer: Self-pay

## 2019-08-09 ENCOUNTER — Encounter: Payer: Self-pay | Admitting: Family Medicine

## 2019-08-09 ENCOUNTER — Ambulatory Visit (INDEPENDENT_AMBULATORY_CARE_PROVIDER_SITE_OTHER): Payer: Medicare HMO | Admitting: Family Medicine

## 2019-08-09 VITALS — BP 132/98 | HR 79 | Temp 98.8°F | Ht 61.5 in | Wt 193.8 lb

## 2019-08-09 DIAGNOSIS — I10 Essential (primary) hypertension: Secondary | ICD-10-CM

## 2019-08-09 DIAGNOSIS — R002 Palpitations: Secondary | ICD-10-CM | POA: Diagnosis not present

## 2019-08-09 NOTE — Progress Notes (Signed)
Chief Complaint  Patient presents with  . Hypertension    History of Present Illness: HPI   73 year old female presents for follow up HTN and palpitations.  Started on HCTZ.  She has noted her mouth is dry.   BP at home 122/70  BP Readings from Last 3 Encounters:  08/09/19 (!) 132/98  07/08/19 (!) 144/92  02/12/18 (!) 142/78    Rare less frequent palpitations. She has been decreasing caffeine.  No associated SOB, no chest pain, no dizziness.  COVID 19 screen No recent travel or known exposure to COVID19 The patient denies respiratory symptoms of COVID 19 at this time.  The importance of social distancing was discussed today.   Review of Systems  Constitutional: Negative for chills and fever.  HENT: Negative for congestion and ear pain.   Eyes: Negative for pain and redness.  Respiratory: Negative for cough and shortness of breath.   Cardiovascular: Negative for chest pain, palpitations and leg swelling.  Gastrointestinal: Negative for abdominal pain, blood in stool, constipation, diarrhea, nausea and vomiting.  Genitourinary: Negative for dysuria.  Musculoskeletal: Negative for falls and myalgias.  Skin: Negative for rash.  Neurological: Negative for dizziness.  Psychiatric/Behavioral: Negative for depression. The patient is not nervous/anxious.       Past Medical History:  Diagnosis Date  . GERD (gastroesophageal reflux disease)     reports that she has never smoked. She has never used smokeless tobacco. She reports that she does not drink alcohol or use drugs.   Current Outpatient Medications:  .  Calcium Carbonate-Vitamin D (CALCIUM 600-D) 600-400 MG-UNIT per tablet, Take 1 tablet by mouth daily.  , Disp: , Rfl:  .  esomeprazole (NEXIUM) 20 MG capsule, Take 20 mg by mouth daily at 12 noon., Disp: , Rfl:  .  hydrochlorothiazide (HYDRODIURIL) 25 MG tablet, Take 1 tablet (25 mg total) by mouth daily., Disp: 30 tablet, Rfl: 11 .  ibuprofen (ADVIL,MOTRIN) 200 MG  tablet, Take 400 mg by mouth every 6 (six) hours as needed for moderate pain., Disp: , Rfl:  .  Multiple Vitamin (MULTIVITAMIN) tablet, Take 1 tablet by mouth daily.  , Disp: , Rfl:  .  Simethicone (GAS RELIEF) 180 MG CAPS, Take 180 mg by mouth daily as needed (for bloating)., Disp: , Rfl:    Observations/Objective: Blood pressure (!) 132/98, pulse 79, temperature 98.8 F (37.1 C), temperature source Temporal, height 5' 1.5" (1.562 m), weight 193 lb 12 oz (87.9 kg), SpO2 95 %.  Physical Exam Constitutional:      General: She is not in acute distress.    Appearance: Normal appearance. She is well-developed. She is not ill-appearing or toxic-appearing.  HENT:     Head: Normocephalic.     Right Ear: Hearing, tympanic membrane, ear canal and external ear normal. Tympanic membrane is not erythematous, retracted or bulging.     Left Ear: Hearing, tympanic membrane, ear canal and external ear normal. Tympanic membrane is not erythematous, retracted or bulging.     Nose: No mucosal edema or rhinorrhea.     Right Sinus: No maxillary sinus tenderness or frontal sinus tenderness.     Left Sinus: No maxillary sinus tenderness or frontal sinus tenderness.     Mouth/Throat:     Pharynx: Uvula midline.  Eyes:     General: Lids are normal. Lids are everted, no foreign bodies appreciated.     Conjunctiva/sclera: Conjunctivae normal.     Pupils: Pupils are equal, round, and reactive to light.  Neck:     Musculoskeletal: Normal range of motion and neck supple.     Thyroid: No thyroid mass or thyromegaly.     Vascular: No carotid bruit.     Trachea: Trachea normal.  Cardiovascular:     Rate and Rhythm: Normal rate and regular rhythm.     Pulses: Normal pulses.     Heart sounds: Normal heart sounds, S1 normal and S2 normal. No murmur. No friction rub. No gallop.   Pulmonary:     Effort: Pulmonary effort is normal. No tachypnea or respiratory distress.     Breath sounds: Normal breath sounds. No  decreased breath sounds, wheezing, rhonchi or rales.  Abdominal:     General: Bowel sounds are normal.     Palpations: Abdomen is soft.     Tenderness: There is no abdominal tenderness.  Skin:    General: Skin is warm and dry.     Findings: No rash.  Neurological:     Mental Status: She is alert.  Psychiatric:        Mood and Affect: Mood is not anxious or depressed.        Speech: Speech normal.        Behavior: Behavior normal. Behavior is cooperative.        Thought Content: Thought content normal.        Judgment: Judgment normal.      Assessment and Plan Essential hypertension, benign  Improved control with home measurement on HCTZ.  Palpitation Less frequent and pt not symptomatic when occurring.       Eliezer Lofts, MD

## 2019-08-09 NOTE — Patient Instructions (Signed)
Continue HCTZ. Continue working on healthy eating and regular exercise.

## 2019-08-09 NOTE — Assessment & Plan Note (Signed)
Improved control with home measurement on HCTZ.

## 2019-08-09 NOTE — Assessment & Plan Note (Signed)
Less frequent and pt not symptomatic when occurring.

## 2019-09-07 DIAGNOSIS — R69 Illness, unspecified: Secondary | ICD-10-CM | POA: Diagnosis not present

## 2019-10-04 DIAGNOSIS — U071 COVID-19: Secondary | ICD-10-CM | POA: Diagnosis not present

## 2019-10-13 ENCOUNTER — Telehealth: Payer: Self-pay

## 2019-10-13 NOTE — Telephone Encounter (Signed)
Pt had covid test at [facility] last wk. Test was positive on 10/04/19. Pt employer requires a negative covid test before returning to work. Pt has been quarantining since 10/04/19. No fever since 10/05/19. Pt will go back to [facility]. To be retested and pt understands it could be still positive from initial covid exposure but pt is going to try since having no symptoms and wants to get back to work. FYI to Dr Diona Browner.

## 2019-10-13 NOTE — Telephone Encounter (Signed)
noted 

## 2019-10-14 DIAGNOSIS — Z20828 Contact with and (suspected) exposure to other viral communicable diseases: Secondary | ICD-10-CM | POA: Diagnosis not present

## 2019-10-18 ENCOUNTER — Encounter: Payer: Self-pay | Admitting: *Deleted

## 2019-10-18 ENCOUNTER — Telehealth: Payer: Self-pay

## 2019-10-18 NOTE — Telephone Encounter (Signed)
Okay to write note to return to work.  -14 days post positive test - no fever -symptoms resolved

## 2019-10-18 NOTE — Telephone Encounter (Signed)
Pt said tested + for covid on 10/04/19 at [facility]. Pt has had no fever since 10/07/19 or 10/08/19. Pt retested on 10/14/19 and was still was positive. Pt has been quarantined for over 14 days; started quarantine on 10/04/19. Pt said she cannot return to work until gets either negative results or get note from PCP that is OK for pt to return to work. Pt has no covid symptoms per review with pt. Pt said [facility] said she could be positive for covid for up to 3 months. Pt did not want to schedule a virtual appt and request a note to return to work and put in [employer]. Pt request cb after reviewed by Dr Diona Browner.

## 2019-10-18 NOTE — Telephone Encounter (Signed)
Letter written as instructed by Dr. Diona Browner.  Ms. Mazzarese notified by telephone that her letter has been sent to her MyChart as requested.

## 2019-11-15 DIAGNOSIS — Z85828 Personal history of other malignant neoplasm of skin: Secondary | ICD-10-CM | POA: Diagnosis not present

## 2019-11-15 DIAGNOSIS — D235 Other benign neoplasm of skin of trunk: Secondary | ICD-10-CM | POA: Diagnosis not present

## 2019-11-15 DIAGNOSIS — D225 Melanocytic nevi of trunk: Secondary | ICD-10-CM | POA: Diagnosis not present

## 2019-11-15 DIAGNOSIS — D2272 Melanocytic nevi of left lower limb, including hip: Secondary | ICD-10-CM | POA: Diagnosis not present

## 2019-11-15 DIAGNOSIS — L814 Other melanin hyperpigmentation: Secondary | ICD-10-CM | POA: Diagnosis not present

## 2019-11-15 DIAGNOSIS — L570 Actinic keratosis: Secondary | ICD-10-CM | POA: Diagnosis not present

## 2019-11-15 DIAGNOSIS — L821 Other seborrheic keratosis: Secondary | ICD-10-CM | POA: Diagnosis not present

## 2019-11-15 DIAGNOSIS — D2271 Melanocytic nevi of right lower limb, including hip: Secondary | ICD-10-CM | POA: Diagnosis not present

## 2019-11-15 DIAGNOSIS — D2371 Other benign neoplasm of skin of right lower limb, including hip: Secondary | ICD-10-CM | POA: Diagnosis not present

## 2020-02-09 ENCOUNTER — Other Ambulatory Visit: Payer: Self-pay | Admitting: Family Medicine

## 2020-02-09 DIAGNOSIS — Z1231 Encounter for screening mammogram for malignant neoplasm of breast: Secondary | ICD-10-CM

## 2020-02-18 ENCOUNTER — Ambulatory Visit: Payer: Medicare HMO | Attending: Internal Medicine

## 2020-02-18 DIAGNOSIS — Z23 Encounter for immunization: Secondary | ICD-10-CM

## 2020-02-18 NOTE — Progress Notes (Signed)
   Covid-19 Vaccination Clinic  Name:  CICLALI DECOUX    MRN: [id number] DOB: October 04, 1946  02/18/2020  Ms. Lex was observed post Covid-19 immunization for 15 minutes without incident. She was provided with Vaccine Information Sheet and instruction to access the V-Safe system.   Ms. Rajagopal was instructed to call 911 with any severe reactions post vaccine: Marland Kitchen Difficulty breathing  . Swelling of face and throat  . A fast heartbeat  . A bad rash all over body  . Dizziness and weakness   Immunizations Administered    Name Date Dose VIS Date Route   Pfizer COVID-19 Vaccine 02/18/2020  9:54 AM 0.3 mL 10/14/2019 Intramuscular   Manufacturer: [place]   Lot: [id number]   [place]: [id number]

## 2020-03-13 ENCOUNTER — Ambulatory Visit: Payer: Medicare HMO | Attending: Internal Medicine

## 2020-03-13 DIAGNOSIS — Z23 Encounter for immunization: Secondary | ICD-10-CM

## 2020-03-13 NOTE — Progress Notes (Signed)
   Covid-19 Vaccination Clinic  Name:  Danielle Kane    MRN: [id number] DOB: [birth year]  03/13/2020  [name] was observed post Covid-19 immunization for 15 minutes without incident. She was provided with Vaccine Information Sheet and instruction to access the V-Safe system.   Ms. Corner was instructed to call 911 with any severe reactions post vaccine: Marland Kitchen Difficulty breathing  . Swelling of face and throat  . A fast heartbeat  . A bad rash all over body  . Dizziness and weakness   Immunizations Administered    Name Date Dose VIS Date Route   Pfizer COVID-19 Vaccine 03/13/2020  3:21 PM 0.3 mL 12/28/2018 Intramuscular   Manufacturer: [place]   Lot: [id number]   [place]: [id number]

## 2020-07-03 ENCOUNTER — Other Ambulatory Visit: Payer: Self-pay | Admitting: Family Medicine

## 2020-07-04 ENCOUNTER — Ambulatory Visit (INDEPENDENT_AMBULATORY_CARE_PROVIDER_SITE_OTHER): Payer: Medicare HMO

## 2020-07-04 ENCOUNTER — Other Ambulatory Visit: Payer: Self-pay

## 2020-07-04 ENCOUNTER — Telehealth: Payer: Self-pay | Admitting: Family Medicine

## 2020-07-04 DIAGNOSIS — Z0000 Encounter for general adult medical examination without abnormal findings: Secondary | ICD-10-CM | POA: Diagnosis not present

## 2020-07-04 DIAGNOSIS — I10 Essential (primary) hypertension: Secondary | ICD-10-CM

## 2020-07-04 NOTE — Telephone Encounter (Signed)
-----   Message from Cloyd Stagers, RT sent at 06/18/2020 11:18 AM EDT ----- Regarding: Lab Orders for Thursday 9.2.2021 Please place lab orders for Thursday 9.2.2021, office visit for physical on Tuesday 9.7.2021 Thank you, Dyke Maes RT(R)

## 2020-07-04 NOTE — Progress Notes (Signed)
Subjective:   Danielle Kane is a 74 y.o. female who presents for Medicare Annual (Subsequent) preventive examination.  Review of Systems: N/A      I connected with the patient today by telephone and verified that I am speaking with the correct person using two identifiers. Location patient: home Location nurse: work Persons participating in the telephone visit: patient, nurse.   I discussed the limitations, risks, security and privacy concerns of performing an evaluation and management service by telephone and the availability of in person appointments. I also discussed with the patient that there may be a patient responsible charge related to this service. The patient expressed understanding and verbally consented to this telephonic visit.        Cardiac Risk Factors include: advanced age (>19men, >77 women);hypertension     Objective:    Today's Vitals   There is no height or weight on file to calculate BMI.  Advanced Directives 07/04/2020 02/02/2018 01/29/2017 07/13/2015  Does Patient Have a Medical Advance Directive? Yes Yes Yes Yes  Type of Paramedic of [place];Living will [place];Living will [place];Living will Living will;Healthcare Power of Attorney  Does patient want to make changes to medical advance directive? - - No - Patient declined -  Copy of [place] in Chart? No - copy requested No - copy requested No - copy requested No - copy requested    Current Medications (verified) Outpatient Encounter Medications as of 07/04/2020  Medication Sig  . Calcium Carbonate-Vitamin D (CALCIUM 600-D) 600-400 MG-UNIT per tablet Take 1 tablet by mouth daily.    Marland Kitchen esomeprazole (NEXIUM) 20 MG capsule Take 20 mg by mouth daily at 12 noon.  . hydrochlorothiazide (HYDRODIURIL) 25 MG tablet TAKE 1 TABLET BY MOUTH EVERY DAY  . ibuprofen (ADVIL,MOTRIN) 200 MG tablet Take 400 mg by mouth every 6 (six) hours as  needed for moderate pain.  . Multiple Vitamin (MULTIVITAMIN) tablet Take 1 tablet by mouth daily.    . Simethicone (GAS RELIEF) 180 MG CAPS Take 180 mg by mouth daily as needed (for bloating).   No facility-administered encounter medications on file as of 07/04/2020.    Allergies (verified) Patient has no known allergies.   History: Past Medical History:  Diagnosis Date  . GERD (gastroesophageal reflux disease)    Past Surgical History:  Procedure Laterality Date  . ABDOMINAL HYSTERECTOMY     Family History  Problem Relation Age of Onset  . Heart failure Mother   . Stroke Father   . Kidney disease Maternal Grandmother   . Coronary artery disease Maternal Grandfather   . Heart attack Maternal Grandfather   . Osteoporosis Paternal Grandfather    Social History   Socioeconomic History  . Marital status: Widowed    Spouse name: Not on file  . Number of children: Not on file  . Years of education: Not on file  . Highest education level: Not on file  Occupational History  . Occupation: Freight forwarder  Tobacco Use  . Smoking status: Never Smoker  . Smokeless tobacco: Never Used  Vaping Use  . Vaping Use: Never used  Substance and Sexual Activity  . Alcohol use: No    Alcohol/week: 0.0 standard drinks  . Drug use: No  . Sexual activity: Not on file  Other Topics Concern  . Not on file  Social History Narrative   Regular exercise-no   Diet fruits and veggies, water    Has living  will, [facility] [facility]ne Cornea and Ned Clines),  full code  (reviewed 2015)   Social Determinants of Health   Financial Resource Strain: Low Risk   . Difficulty of Paying Living Expenses: Not hard at all  Food Insecurity: No Food Insecurity  . Worried About Charity fundraiser in the Last Year: Never true  . Ran Out of Food in the Last Year: Never true  Transportation Needs: No Transportation Needs  . Lack of Transportation (Medical): No  . Lack of Transportation (Non-Medical): No  Physical Activity:  Inactive  . Days of Exercise per Week: 0 days  . Minutes of Exercise per Session: 0 min  Stress: No Stress Concern Present  . Feeling of Stress : Not at all  Social Connections:   . Frequency of Communication with Friends and Family: Not on file  . Frequency of Social Gatherings with Friends and Family: Not on file  . Attends Religious Services: Not on file  . Active Member of Clubs or Organizations: Not on file  . Attends Archivist Meetings: Not on file  . Marital Status: Not on file    Tobacco Counseling Counseling given: Not Answered   Clinical Intake:  Pre-visit preparation completed: Yes  Pain : No/denies pain     Nutritional Risks: None Diabetes: No  How often do you need to have someone help you when you read instructions, pamphlets, or other written materials from your doctor or pharmacy?: 1 - Never What is the last grade level you completed in school?: 12th  Diabetic: No Nutrition Risk Assessment:  Has the patient had any N/V/D within the last 2 months?  No  Does the patient have any non-healing wounds?  No  Has the patient had any unintentional weight loss or weight gain?  No   Diabetes:  Is the patient diabetic?  No  If diabetic, was a CBG obtained today?  N/A Did the patient bring in their glucometer from home?  N/A How often do you monitor your CBG's? N/A.   Financial Strains and Diabetes Management:  Are you having any financial strains with the device, your supplies or your medication? N/A.  Does the patient want to be seen by Chronic Care Management for management of their diabetes?  N/A Would the patient like to be referred to a Nutritionist or for Diabetic Management?  N/A  Interpreter Needed?: No  Information entered by :: CJohnson, LPN   Activities of Daily Living In your present state of health, do you have any difficulty performing the following activities: 07/04/2020  Hearing? Y  Comment some "static" in ear  Vision? N    Difficulty concentrating or making decisions? N  Walking or climbing stairs? N  Dressing or bathing? N  Doing errands, shopping? N  Preparing Food and eating ? N  Using the Toilet? N  In the past six months, have you accidently leaked urine? N  Do you have problems with loss of bowel control? N  Managing your Medications? N  Managing your Finances? N  Housekeeping or managing your Housekeeping? N  Some recent data might be hidden    Patient Care Team: Jinny Sanders, MD as PCP - General  Indicate any recent Medical Services you may have received from other than [facility] providers in the past year (date may be approximate).     Assessment:   This is a routine wellness examination for [place].  Hearing/Vision screen  Hearing Screening   125Hz  250Hz  500Hz  1000Hz  2000Hz  3000Hz   4000Hz  6000Hz  8000Hz   Right ear:           Left ear:           Vision Screening Comments: Patient gets annual eye exams  Dietary issues and exercise activities discussed: Current Exercise Habits: The patient does not participate in regular exercise at present, Exercise limited by: None identified  Goals    . Follow up with Primary Care Provider     Starring 02/02/2018, I will continue to take medications as prescribed and to keep appointments with PCP as scheduled.     . Patient Stated     07/04/2020, I will maintain and continue medications as prescribed.       Depression Screen PHQ 2/9 Scores 07/04/2020 07/08/2019 02/02/2018 01/29/2017 01/22/2016 07/20/2015 02/14/2014  PHQ - 2 Score 0 0 0 0 0 0 0  PHQ- 9 Score 0 - 0 - - - -    Fall Risk Fall Risk  07/04/2020 07/08/2019 02/02/2018 01/29/2017 01/22/2016  Falls in the past year? 1 0 No No No  Comment slipped on steps - - - -  Number falls in past yr: 0 - - - -  Injury with Fall? 0 - - - -  Risk for fall due to : No Fall Risks - - - -  Follow up Falls evaluation completed;Falls prevention discussed - - - -    Any stairs in or around the home? Yes  If so, are there  any without handrails? No  Home free of loose throw rugs in walkways, pet beds, electrical cords, etc? Yes  Adequate lighting in your home to reduce risk of falls? Yes   ASSISTIVE DEVICES UTILIZED TO PREVENT FALLS:  Life alert? No  Use of a cane, walker or w/c? No  Grab bars in the bathroom? No  Shower chair or bench in shower? No  Elevated toilet seat or a handicapped toilet? No   TIMED UP AND GO:  Was the test performed? N/A, telephonic visit .    Cognitive Function: MMSE - Mini Mental State Exam 07/04/2020 02/02/2018  Orientation to time 5 5  Orientation to Place 5 5  Registration 3 3  Attention/ Calculation 5 0  Recall 3 3  Language- name 2 objects - 0  Language- repeat 1 1  Language- follow 3 step command - 3  Language- read & follow direction - 0  Write a sentence - 0  Copy design - 0  Total score - 20  Mini Cog  Mini-Cog screen was completed. Maximum score is 22. A value of 0 denotes this part of the MMSE was not completed or the patient failed this part of the Mini-Cog screening.       Immunizations Immunization History  Administered Date(s) Administered  . Fluad Quad(high Dose 65+) 07/08/2019  . Influenza Split 08/28/2011, 08/26/2012  . Influenza Whole 08/17/2007, 07/25/2008, 08/02/2009, 07/15/2010  . Influenza, High Dose Seasonal PF 09/07/2017  . Influenza,inj,Quad PF,6+ Mos 08/30/2013, 08/11/2014, 07/20/2015, 08/29/2016  . PFIZER SARS-COV-2 Vaccination 02/18/2020, 03/13/2020  . Pneumococcal Conjugate-13 02/14/2014  . Pneumococcal Polysaccharide-23 08/28/2011  . Td 04/03/2000  . Tdap 11/17/2011  . Zoster 11/17/2011    TDAP status: Up to date Flu Vaccine status: due, will get at upcoming visit  Pneumococcal vaccine status: Up to date Covid-19 vaccine status: Completed vaccines  Qualifies for Shingles Vaccine? Yes   Zostavax completed Yes   Shingrix Completed?: No.    Education has been provided regarding the importance of this vaccine. Patient has  been advised to call insurance company to determine out of pocket expense if they have not yet received this vaccine. Advised may also receive vaccine at local pharmacy or Health Dept. Verbalized acceptance and understanding.  Screening Tests Health Maintenance  Topic Date Due  . MAMMOGRAM  02/20/2018  . INFLUENZA VACCINE  06/03/2020  . TETANUS/TDAP  11/16/2021  . Fecal DNA (Cologuard)  07/23/2022  . DEXA SCAN  Completed  . COVID-19 Vaccine  Completed  . Hepatitis C Screening  Completed  . PNA vac Low Risk Adult  Completed    Health Maintenance  Health Maintenance Due  Topic Date Due  . MAMMOGRAM  02/20/2018  . INFLUENZA VACCINE  06/03/2020    Colorectal cancer screening: Completed cologuard 07/24/2019. Repeat every 3 years Mammogram status:due, will schedule appointment soon  Bone Density status: due, will schedule appointment soon    Lung Cancer Screening: (Low Dose CT Chest recommended if Age 71-80 years, 30 pack-year currently smoking OR have quit w/in 15years.) does not qualify.    Additional Screening:  Hepatitis C Screening: does not qualify; Completed N/A  Vision Screening: Recommended annual ophthalmology exams for early detection of glaucoma and other disorders of the eye. Is the patient up to date with their annual eye exam?  Yes  Who is the provider or what is the name of the office in which the patient attends annual eye exams? Dr. Angelina Pih  If pt is not established with a provider, would they like to be referred to a provider to establish care? No .   Dental Screening: Recommended annual dental exams for proper oral hygiene  Community Resource Referral / Chronic Care Management: CRR required this visit?  No   CCM required this visit?  No      Plan:     I have personally reviewed and noted the following in the patient's chart:   . Medical and social history . Use of alcohol, tobacco or illicit drugs  . Current medications and  supplements . Functional ability and status . Nutritional status . Physical activity . Advanced directives . List of other physicians . Hospitalizations, surgeries, and ER visits in previous 12 months . Vitals . Screenings to include cognitive, depression, and falls . Referrals and appointments  In addition, I have reviewed and discussed with patient certain preventive protocols, quality metrics, and best practice recommendations. A written personalized care plan for preventive services as well as general preventive health recommendations were provided to patient.   Due to this being a telephonic visit, the after visit summary with patients personalized plan was offered to patient via mail or my-chart.  Patient preferred to pick up at office at next visit.   Andrez Grime, LPN   [id number]/04/[id number]

## 2020-07-04 NOTE — Progress Notes (Signed)
PCP notes:  Health Maintenance: Mammogram- due, will schedule appointment soon   Dexa- due, will schedule appointment soon   Flu- due   Abnormal Screenings: none   Patient concerns: Left knee discomfort. Onset 2 months ago.   Nurse concerns: none   Next PCP appt.: 07/10/2020 @ 3:40 pm

## 2020-07-04 NOTE — Patient Instructions (Signed)
Danielle Kane , Thank you for taking time to come for your Medicare Wellness Visit. I appreciate your ongoing commitment to your health goals. Please review the following plan we discussed and let me know if I can assist you in the future.   Screening recommendations/referrals: Colonoscopy: cologuard completed 07/24/2019, due 07/2022 Mammogram: due, will schedule appointment Bone Density: due, will schedule appointment Recommended yearly ophthalmology/optometry visit for glaucoma screening and checkup Recommended yearly dental visit for hygiene and checkup  Vaccinations: Influenza vaccine: due, will get at upcoming physical  Pneumococcal vaccine: Completed series Tdap vaccine: Up to date, completed 11/17/2011, due 08/2022 Shingles vaccine: due, check with your insurance regarding coverage    Covid-19:Completed series  Advanced directives: Please bring a copy of your POA (Power of Attorney) and/or Living Will to your next appointment.   Conditions/risks identified: hypertension  Next appointment: Follow up in one year for your annual wellness visit    Preventive Care 21 Years and Older, Female Preventive care refers to lifestyle choices and visits with your health care provider that can promote health and wellness. What does preventive care include?  A yearly physical exam. This is also called an annual well check.  Dental exams once or twice a year.  Routine eye exams. Ask your health care provider how often you should have your eyes checked.  Personal lifestyle choices, including:  Daily care of your teeth and gums.  Regular physical activity.  Eating a healthy diet.  Avoiding tobacco and drug use.  Limiting alcohol use.  Practicing safe sex.  Taking low-dose aspirin every day.  Taking vitamin and mineral supplements as recommended by your health care provider. What happens during an annual well check? The services and screenings done by your health care provider during  your annual well check will depend on your age, overall health, lifestyle risk factors, and family history of disease. Counseling  Your health care provider may ask you questions about your:  Alcohol use.  Tobacco use.  Drug use.  Emotional well-being.  Home and relationship well-being.  Sexual activity.  Eating habits.  History of falls.  Memory and ability to understand (cognition).  Work and work Statistician.  Reproductive health. Screening  You may have the following tests or measurements:  Height, weight, and BMI.  Blood pressure.  Lipid and cholesterol levels. These may be checked every 5 years, or more frequently if you are over 106 years old.  Skin check.  Lung cancer screening. You may have this screening every year starting at age 13 if you have a 30-pack-year history of smoking and currently smoke or have quit within the past 15 years.  Fecal occult blood test (FOBT) of the stool. You may have this test every year starting at age 41.  Flexible sigmoidoscopy or colonoscopy. You may have a sigmoidoscopy every 5 years or a colonoscopy every 10 years starting at age 34.  Hepatitis C blood test.  Hepatitis B blood test.  Sexually transmitted disease (STD) testing.  Diabetes screening. This is done by checking your blood sugar (glucose) after you have not eaten for a while (fasting). You may have this done every 1-3 years.  Bone density scan. This is done to screen for osteoporosis. You may have this done starting at age 66.  Mammogram. This may be done every 1-2 years. Talk to your health care provider about how often you should have regular mammograms. Talk with your health care provider about your test results, treatment options, and if necessary, the  need for more tests. Vaccines  Your health care provider may recommend certain vaccines, such as:  Influenza vaccine. This is recommended every year.  Tetanus, diphtheria, and acellular pertussis (Tdap,  Td) vaccine. You may need a Td booster every 10 years.  Zoster vaccine. You may need this after age 89.  Pneumococcal 13-valent conjugate (PCV13) vaccine. One dose is recommended after age 76.  Pneumococcal polysaccharide (PPSV23) vaccine. One dose is recommended after age 57. Talk to your health care provider about which screenings and vaccines you need and how often you need them. This information is not intended to replace advice given to you by your health care provider. Make sure you discuss any questions you have with your health care provider. Document Released: 11/16/2015 Document Revised: 07/09/2016 Document Reviewed: 08/21/2015 Elsevier Interactive Patient Education  2017 [employer] Prevention in the Home Falls can cause injuries. They can happen to people of all ages. There are many things you can do to make your home safe and to help prevent falls. What can I do on the outside of my home?  Regularly fix the edges of walkways and driveways and fix any cracks.  Remove anything that might make you trip as you walk through a door, such as a raised step or threshold.  Trim any bushes or trees on the path to your home.  Use bright outdoor lighting.  Clear any walking paths of anything that might make someone trip, such as rocks or tools.  Regularly check to see if handrails are loose or broken. Make sure that both sides of any steps have handrails.  Any raised decks and porches should have guardrails on the edges.  Have any leaves, snow, or ice cleared regularly.  Use sand or salt on walking paths during winter.  Clean up any spills in your garage right away. This includes oil or grease spills. What can I do in the bathroom?  Use night lights.  Install grab bars by the toilet and in the tub and shower. Do not use towel bars as grab bars.  Use non-skid mats or decals in the tub or shower.  If you need to sit down in the shower, use a plastic, non-slip  stool.  Keep the floor dry. Clean up any water that spills on the floor as soon as it happens.  Remove soap buildup in the tub or shower regularly.  Attach bath mats securely with double-sided non-slip rug tape.  Do not have throw rugs and other things on the floor that can make you trip. What can I do in the bedroom?  Use night lights.  Make sure that you have a light by your bed that is easy to reach.  Do not use any sheets or blankets that are too big for your bed. They should not hang down onto the floor.  Have a firm chair that has side arms. You can use this for support while you get dressed.  Do not have throw rugs and other things on the floor that can make you trip. What can I do in the kitchen?  Clean up any spills right away.  Avoid walking on wet floors.  Keep items that you use a lot in easy-to-reach places.  If you need to reach something above you, use a strong step stool that has a grab bar.  Keep electrical cords out of the way.  Do not use floor polish or wax that makes floors slippery. If you must use wax,  use non-skid floor wax.  Do not have throw rugs and other things on the floor that can make you trip. What can I do with my stairs?  Do not leave any items on the stairs.  Make sure that there are handrails on both sides of the stairs and use them. Fix handrails that are broken or loose. Make sure that handrails are as long as the stairways.  Check any carpeting to make sure that it is firmly attached to the stairs. Fix any carpet that is loose or worn.  Avoid having throw rugs at the top or bottom of the stairs. If you do have throw rugs, attach them to the floor with carpet tape.  Make sure that you have a light switch at the top of the stairs and the bottom of the stairs. If you do not have them, ask someone to add them for you. What else can I do to help prevent falls?  Wear shoes that:  Do not have high heels.  Have rubber bottoms.  Are  comfortable and fit you well.  Are closed at the toe. Do not wear sandals.  If you use a stepladder:  Make sure that it is fully opened. Do not climb a closed stepladder.  Make sure that both sides of the stepladder are locked into place.  Ask someone to hold it for you, if possible.  Clearly mark and make sure that you can see:  Any grab bars or handrails.  First and last steps.  Where the edge of each step is.  Use tools that help you move around (mobility aids) if they are needed. These include:  Canes.  Walkers.  Scooters.  Crutches.  Turn on the lights when you go into a dark area. Replace any light bulbs as soon as they burn out.  Set up your furniture so you have a clear path. Avoid moving your furniture around.  If any of your floors are uneven, fix them.  If there are any pets around you, be aware of where they are.  Review your medicines with your doctor. Some medicines can make you feel dizzy. This can increase your chance of falling. Ask your doctor what other things that you can do to help prevent falls. This information is not intended to replace advice given to you by your health care provider. Make sure you discuss any questions you have with your health care provider. Document Released: 08/16/2009 Document Revised: 03/27/2016 Document Reviewed: 11/24/2014 Elsevier Interactive Patient Education  2017 [employer].

## 2020-07-05 ENCOUNTER — Other Ambulatory Visit: Payer: Self-pay

## 2020-07-05 ENCOUNTER — Other Ambulatory Visit (INDEPENDENT_AMBULATORY_CARE_PROVIDER_SITE_OTHER): Payer: Medicare HMO

## 2020-07-05 DIAGNOSIS — I10 Essential (primary) hypertension: Secondary | ICD-10-CM

## 2020-07-05 LAB — LIPID PANEL
Cholesterol: 140 mg/dL (ref 0–200)
HDL: 44.5 mg/dL (ref >39.00)
LDL Cholesterol: 78 mg/dL (ref 0–99)
NonHDL: 95.06
Total CHOL/HDL Ratio: 3
Triglycerides: 86 mg/dL (ref 0.0–149.0)
VLDL: 17.2 mg/dL (ref 0.0–40.0)

## 2020-07-05 LAB — COMPREHENSIVE METABOLIC PANEL
ALT: 11 U/L (ref 0–35)
AST: 15 U/L (ref 0–37)
Albumin: 3.8 g/dL (ref 3.5–5.2)
Alkaline Phosphatase: 52 U/L (ref 39–117)
BUN: 14 mg/dL (ref 6–23)
CO2: 28 mEq/L (ref 19–32)
Calcium: 9.1 mg/dL (ref 8.4–10.5)
Chloride: 102 mEq/L (ref 96–112)
Creatinine, Ser: 0.96 mg/dL (ref 0.40–1.20)
GFR: 56.74 mL/min — ABNORMAL LOW (ref >60.00)
Glucose, Bld: 91 mg/dL (ref 70–99)
Potassium: 4 mEq/L (ref 3.5–5.1)
Sodium: 138 mEq/L (ref 135–145)
Total Bilirubin: 0.7 mg/dL (ref 0.2–1.2)
Total Protein: 7.1 g/dL (ref 6.0–8.3)

## 2020-07-05 NOTE — Progress Notes (Signed)
No critical labs need to be addressed urgently. We will discuss labs in detail at upcoming office visit.   

## 2020-07-10 ENCOUNTER — Encounter: Payer: Self-pay | Admitting: Family Medicine

## 2020-07-10 ENCOUNTER — Ambulatory Visit (INDEPENDENT_AMBULATORY_CARE_PROVIDER_SITE_OTHER): Payer: Medicare HMO | Admitting: Family Medicine

## 2020-07-10 ENCOUNTER — Other Ambulatory Visit: Payer: Self-pay

## 2020-07-10 VITALS — BP 128/84 | HR 87 | Temp 97.7°F | Ht 61.0 in | Wt 193.0 lb

## 2020-07-10 DIAGNOSIS — M8580 Other specified disorders of bone density and structure, unspecified site: Secondary | ICD-10-CM | POA: Diagnosis not present

## 2020-07-10 DIAGNOSIS — Z0000 Encounter for general adult medical examination without abnormal findings: Secondary | ICD-10-CM | POA: Diagnosis not present

## 2020-07-10 DIAGNOSIS — I10 Essential (primary) hypertension: Secondary | ICD-10-CM | POA: Diagnosis not present

## 2020-07-10 DIAGNOSIS — Z23 Encounter for immunization: Secondary | ICD-10-CM | POA: Diagnosis not present

## 2020-07-10 DIAGNOSIS — M25561 Pain in right knee: Secondary | ICD-10-CM | POA: Insufficient documentation

## 2020-07-10 NOTE — Patient Instructions (Addendum)
Increase walking 3-5 times a week. Keep up with low cholesterol diet. Increase good fats. Can try glucosamine  500 mg 1-3 times daily.   Please call the location of your choice from the menu below to schedule your Mammogram and/or Bone Density appointment.    [place]   1. [facility]                      Phone:  [phone], [place] [address]                                                             Services: Traditional and 3D Mammogram, Bone Density   2. [facility]                 Phone: [phone] [address], [place] [address]    Service: Bone Density ONLY   *this site does NOT perform mammograms  3. [facility]                        Phone:  [phone] [address] [place], [place] [address]                                            Services:  3D Mammogram and Bone Density    [place]  1. [place] [facility]   Phone:  [phone]   [place], [place] [address]                                            Services: 3D Mammogram and Bone Density  2. [place] [facility])  Phone:  [phone]   [address]  [place], [place] [address]                                              Services:  3D Mammogram and Bone Density  

## 2020-07-10 NOTE — Assessment & Plan Note (Signed)
Would like to proceed with DEXA.

## 2020-07-10 NOTE — Assessment & Plan Note (Signed)
Possible patelloemoral syndrome vs. True OA.  Treat with glucosamine,  eval further if not improving

## 2020-07-10 NOTE — Progress Notes (Signed)
Chief Complaint  Patient presents with  . Annual Exam    part 2    History of Present Illness: HPI   The patient presents for complete physical and review of chronic health problems. He/She also has the following acute concerns today: She has noted several months of buzzing static sound in both eras but worse in left ear. No ear pain. Hearing loss that worse in left ear.  The patient saw a LPN or RN for medicare wellness visit.  Prevention and wellness was reviewed in detail. Note reviewed and important notes copied below.  Health Maintenance: Mammogram- due, will schedule appointment soon   Dexa- due, will schedule appointment soon   Flu- due   Abnormal Screenings: None  07/10/20  Hypertension:   Good control on HCTZ BP Readings from Last 3 Encounters:  07/10/20 128/84  08/09/19 (!) 132/98  07/08/19 (!) 144/92  Using medication without problems or lightheadedness:  none Chest pain with exertion:none Edema:none Short of breath:none Average home BPs: Other issues:  Reviewed labs in detail  Lab Results  Component Value Date   CHOL 140 07/05/2020   HDL 44.50 07/05/2020   LDLCALC 78 07/05/2020   TRIG 86.0 07/05/2020   CHOLHDL 3 07/05/2020  Exercise: minimal  Diet: moderate.  She is not interested in cholesterol med to treat. The 10-year ASCVD risk score Mikey Bussing DC Brooke Bonito., et al., 2013) is: 18.3%   Values used to calculate the score:     Age: 26 years     Sex: Female     Is Non-Hispanic African American: No     Diabetic: No     Tobacco smoker: No     Systolic Blood Pressure: 702 mmHg     Is BP treated: Yes     HDL Cholesterol: 44.5 mg/dL     Total Cholesterol: 140 mg/dL   This visit occurred during the SARS-CoV-2 public health emergency.  Safety protocols were in place, including screening questions prior to the visit, additional usage of staff PPE, and extensive cleaning of exam room while observing appropriate contact time as indicated for disinfecting  solutions.   COVID 19 screen:  No recent travel or known exposure to COVID19 The patient denies respiratory symptoms of COVID 19 at this time. The importance of social distancing was discussed today.     Review of Systems  Constitutional: Negative for chills and fever.  HENT: Negative for congestion and ear pain.   Eyes: Negative for pain and redness.  Respiratory: Negative for cough and shortness of breath.   Cardiovascular: Negative for chest pain, palpitations and leg swelling.  Gastrointestinal: Negative for abdominal pain, blood in stool, constipation, diarrhea, nausea and vomiting.  Genitourinary: Negative for dysuria.  Musculoskeletal: Negative for falls and myalgias.  Skin: Negative for rash.  Neurological: Negative for dizziness.  Psychiatric/Behavioral: Negative for depression. The patient is not nervous/anxious.       Past Medical History:  Diagnosis Date  . GERD (gastroesophageal reflux disease)     reports that she has never smoked. She has never used smokeless tobacco. She reports that she does not drink alcohol and does not use drugs.   Current Outpatient Medications:  .  Calcium Carbonate-Vitamin D (CALCIUM 600-D) 600-400 MG-UNIT per tablet, Take 1 tablet by mouth daily.  , Disp: , Rfl:  .  esomeprazole (NEXIUM) 20 MG capsule, Take 20 mg by mouth daily at 12 noon., Disp: , Rfl:  .  hydrochlorothiazide (HYDRODIURIL) 25 MG tablet, TAKE 1 TABLET BY MOUTH EVERY  DAY, Disp: 30 tablet, Rfl: 0 .  ibuprofen (ADVIL,MOTRIN) 200 MG tablet, Take 400 mg by mouth every 6 (six) hours as needed for moderate pain., Disp: , Rfl:  .  Multiple Vitamin (MULTIVITAMIN) tablet, Take 1 tablet by mouth daily.  , Disp: , Rfl:  .  Simethicone (GAS RELIEF) 180 MG CAPS, Take 180 mg by mouth daily as needed (for bloating)., Disp: , Rfl:    Observations/Objective: Blood pressure 128/84, pulse 87, temperature 97.7 F (36.5 C), temperature source Temporal, height 5\' 1"  (1.549 m), weight 193 lb  (87.5 kg), SpO2 99 %.  Physical Exam Musculoskeletal:     Right knee: Crepitus present. No bony tenderness. Normal range of motion. No tenderness. No LCL laxity or MCL laxity. Normal alignment, normal meniscus and normal patellar mobility.     Left knee: Normal. Normal range of motion.     Assessment and Plan The patient's preventative maintenance and recommended screening tests for an annual wellness exam were reviewed in full today. Brought up to date unless services declined.  Counselled on the importance of diet, exercise, and its role in overall health and mortality. The patient's FH and SH was reviewed, including their home life, tobacco status, and drug and alcohol status.   Last DEXA 2017, due for repeat in5 years, osteopenia.  She would like to do DEXA now. Mammogram4/2017, no family history, planq 2 years... due now Colonoscopy 2007, rpt in 10 years, 2017.No family history.. Plan Q3 year cologuard instead... 07/2019 negative Vaccines: Uptodate with Tdap,  CPVID19 series, PNA , Zoster, prevnar,  given flu shot today 07/10/20 Will plan to get shingrix series. DVE/pap: Not indicated.. S/p TAH hy sterectomy.      Hep C: Done  Essential hypertension, benign Well controlled. Continue current medication.   Osteopenia Would like to proceed with DEXA.  Right knee pain Possible patelloemoral syndrome vs. True OA.  Treat with glucosamine,  eval further if not improving    Eliezer Lofts, MD

## 2020-07-10 NOTE — Assessment & Plan Note (Signed)
Well controlled. Continue current medication.  

## 2020-07-29 ENCOUNTER — Other Ambulatory Visit: Payer: Self-pay | Admitting: Family Medicine

## 2020-11-14 DIAGNOSIS — D2272 Melanocytic nevi of left lower limb, including hip: Secondary | ICD-10-CM | POA: Diagnosis not present

## 2020-11-14 DIAGNOSIS — L570 Actinic keratosis: Secondary | ICD-10-CM | POA: Diagnosis not present

## 2020-11-14 DIAGNOSIS — L821 Other seborrheic keratosis: Secondary | ICD-10-CM | POA: Diagnosis not present

## 2020-11-14 DIAGNOSIS — D225 Melanocytic nevi of trunk: Secondary | ICD-10-CM | POA: Diagnosis not present

## 2020-11-14 DIAGNOSIS — D485 Neoplasm of uncertain behavior of skin: Secondary | ICD-10-CM | POA: Diagnosis not present

## 2020-11-14 DIAGNOSIS — D2271 Melanocytic nevi of right lower limb, including hip: Secondary | ICD-10-CM | POA: Diagnosis not present

## 2020-11-14 DIAGNOSIS — L814 Other melanin hyperpigmentation: Secondary | ICD-10-CM | POA: Diagnosis not present

## 2021-06-03 ENCOUNTER — Other Ambulatory Visit: Payer: Self-pay

## 2021-06-03 ENCOUNTER — Encounter (HOSPITAL_COMMUNITY): Payer: Self-pay

## 2021-06-03 ENCOUNTER — Telehealth: Payer: Self-pay

## 2021-06-03 ENCOUNTER — Emergency Department (HOSPITAL_COMMUNITY)
Admission: EM | Admit: 2021-06-03 | Discharge: 2021-06-03 | Disposition: A | Discharge status: Home / Self Care | Payer: Medicare HMO | Attending: Emergency Medicine | Admitting: Emergency Medicine

## 2021-06-03 ENCOUNTER — Emergency Department (HOSPITAL_COMMUNITY): Payer: Medicare HMO

## 2021-06-03 DIAGNOSIS — R079 Chest pain, unspecified: Secondary | ICD-10-CM | POA: Diagnosis not present

## 2021-06-03 DIAGNOSIS — M79602 Pain in left arm: Secondary | ICD-10-CM | POA: Diagnosis present

## 2021-06-03 DIAGNOSIS — Z79899 Other long term (current) drug therapy: Secondary | ICD-10-CM | POA: Insufficient documentation

## 2021-06-03 DIAGNOSIS — I10 Essential (primary) hypertension: Secondary | ICD-10-CM | POA: Diagnosis not present

## 2021-06-03 DIAGNOSIS — M79622 Pain in left upper arm: Secondary | ICD-10-CM | POA: Diagnosis not present

## 2021-06-03 LAB — BASIC METABOLIC PANEL
Anion gap: 7 (ref 5–15)
BUN: 8 mg/dL (ref 8–23)
CO2: 27 mmol/L (ref 22–32)
Calcium: 9.4 mg/dL (ref 8.9–10.3)
Chloride: 97 mmol/L — ABNORMAL LOW (ref 98–111)
Creatinine, Ser: 0.86 mg/dL (ref 0.44–1.00)
GFR, Estimated: >60 mL/min (ref >60)
Glucose, Bld: 105 mg/dL — ABNORMAL HIGH (ref 70–99)
Potassium: 4.4 mmol/L (ref 3.5–5.1)
Sodium: 131 mmol/L — ABNORMAL LOW (ref 135–145)

## 2021-06-03 LAB — CBC
HCT: 44.6 % (ref 36.0–46.0)
Hemoglobin: 14.6 g/dL (ref 12.0–15.0)
MCH: 28.5 pg (ref 26.0–34.0)
MCHC: 32.7 g/dL (ref 30.0–36.0)
MCV: 87.1 fL (ref 80.0–100.0)
Platelets: 308 10*3/uL (ref 150–400)
RBC: 5.12 MIL/uL — ABNORMAL HIGH (ref 3.87–5.11)
RDW: 13.3 % (ref 11.5–15.5)
WBC: 6.2 10*3/uL (ref 4.0–10.5)
nRBC: 0 % (ref 0.0–0.2)

## 2021-06-03 LAB — TROPONIN I (HIGH SENSITIVITY)
Troponin I (High Sensitivity): 6 ng/L (ref <18)
Troponin I (High Sensitivity): 5 ng/L (ref <18)

## 2021-06-03 MED ORDER — ACETAMINOPHEN 500 MG PO TABS
1000.0000 mg | ORAL_TABLET | Freq: Once | ORAL | Status: AC
  Administered 2021-06-03: 1000 mg via ORAL
  Filled 2021-06-03: qty 2

## 2021-06-03 MED ORDER — IBUPROFEN 400 MG PO TABS
400.0000 mg | ORAL_TABLET | Freq: Once | ORAL | Status: AC
  Administered 2021-06-03: 400 mg via ORAL
  Filled 2021-06-03: qty 1

## 2021-06-03 MED ORDER — CYCLOBENZAPRINE HCL 10 MG PO TABS: 5.0000 mg | ORAL_TABLET | Freq: Two times a day (BID) | ORAL | 0 refills | Status: DC | PRN

## 2021-06-03 MED ORDER — CYCLOBENZAPRINE HCL 10 MG PO TABS
5.0000 mg | ORAL_TABLET | Freq: Once | ORAL | Status: AC
  Administered 2021-06-03: 5 mg via ORAL
  Filled 2021-06-03: qty 1

## 2021-06-03 NOTE — ED Triage Notes (Signed)
Presents with Left posterior shoulder pain radiating down her arm into her fingers since Saturday. Pt reports tingling to her finger tips. Pt denies N/V, dizziness or SOB

## 2021-06-03 NOTE — Telephone Encounter (Signed)
Per chart review tab pt is presently at [facility] ED. Sending note to DR Diona Browner and Butch Penny CMA.

## 2021-06-03 NOTE — Discharge Instructions (Addendum)
I suspect that your left shoulder pain is due to a sprain or strain injury.  It could also be due to something called radiculopathy.  Your chest x-ray was reassuring and did not see any broken bones.  Your blood work and EKG were also reassuring, you are not having a heart attack.  I have prescribed a short course of muscle relaxers that may help with your pain.  I would also recommend taking ibuprofen and Tylenol as needed for pain at home.  Please follow with your primary care doctor.  Do not hesitate to return to this emergency department if your symptoms should significantly change or worsen.

## 2021-06-03 NOTE — Telephone Encounter (Signed)
[facility] - Client TELEPHONE ADVICE RECORD [facility] Patient Name: Danielle Kane JOB E Gender: Female DOB: 1946/01/21 Age: 75 Y 74 M 19 D Return Phone Number: [phone] (Primary) Address[place] [address] Client [facility] - Client Client Site [place] - Day Physician Eliezer Lofts - MD Contact Type Call Who Is Calling Patient / Member / Family / Caregiver Call Type Triage / Clinical Caller Name Raquel Sarna Relationship To Patient Other Return Phone Number [phone] (Primary) Chief Complaint NUMBNESS/TINGLING- sudden on one side of the body or face Reason for Call Symptomatic / Request for [place] is with the office. She has a patient who has pain in her shoulder and it is causing numbness and tingling in her left hand. [place] Not Listed [facility] Translation No Nurse Assessment Nurse: Ysidro Evert, RN, Levada Dy Date/Time (Eastern Time): 06/03/2021 8:17:00 AM Confirm and document reason for call. If symptomatic, describe symptoms. ---Caller states she is having shoulder pain that started Saturday morning. She had no injury. It hurts more with movement and the pain goes down her arm. No fever Does the patient have any new or worsening symptoms? ---Yes Will a triage be completed? ---Yes Related visit to physician within the last 2 weeks? ---No Does the PT have any chronic conditions? (i.e. diabetes, asthma, this includes High risk factors for pregnancy, etc.) ---No Is this a behavioral health or substance abuse call? ---No Guidelines Guideline Title Affirmed Question Affirmed Notes Nurse Date/Time [name] Time) Shoulder Pain [1] Age > 40 AND [2] no obvious cause AND [3] pain even when not moving the arm (Exception: pain is clearly made worse Ysidro Evert, RN, Levada Dy 06/03/2021 8:18:14 AM PLEASE NOTE: All timestamps contained  within this report are represented as [place] Standard Time. CONFIDENTIALTY NOTICE: This fax transmission is intended only for the addressee. It contains information that is legally privileged, confidential or otherwise protected from use or disclosure. If you are not the intended recipient, you are strictly prohibited from reviewing, disclosing, copying using or disseminating any of this information or taking any action in reliance on or regarding this information. If you have received this fax in error, please notify us immediately by telephone so that we can arrange for its return to [place]. Phone: [phone], Toll-Free: [phone], Fax: [phone] Page: 2 of 2 Call Id: [id number] Guidelines Guideline Title Affirmed Question Affirmed Notes Nurse Date/Time [name] Time) by moving arm or bending neck) Disp. Time [name] Time) Disposition Final User 06/03/2021 8:15:39 AM Send to Urgent Queue Sonny Dandy 06/03/2021 8:21:34 AM Go to ED Now Yes Ysidro Evert, RN, [place] Shutter Disagree/Comply Comply Caller Understands Yes PreDisposition Did not know what to do Care Advice Given Per Guideline GO TO ED NOW: * You need to be seen in the Emergency Department. * Leave now. Drive carefully. * Another adult should drive. CARE ADVICE given per Shoulder Pain (Adult) guideline CALL EMS IF: * The patient passes out, starts acting confused or becomes too weak to stand. Referrals GO TO FACILITY OTHER - SPECIFY

## 2021-06-03 NOTE — ED Provider Notes (Signed)
[facility]-day [facility] Provider Note   CSN: [id number] Arrival date & time: 06/03/21  [id number]     History Chief Complaint  Patient presents with   Arm Pain    Danielle Kane is a 75 y.o. female.   Arm Pain Pertinent negatives include no chest pain, no abdominal pain and no shortness of breath.  75 year old female with a past medical history of shingles, hypertension presenting to the emergency department with left shoulder pain.  She states this shoulder pain has been present for the past 2 days.  She states it feels like it starts in the posterior left shoulder and then radiates down her arm to the area of about her mid upper arm.  It is aching in nature.  She denies any burning or electrical sensation.  She denies any numbness or weakness in her left upper extremity.  Is been constant for the past and is worsened by moving her left shoulder.  It was not improved with ibuprofen.  She denies any recent trauma.  She denies any exertional component to her symptoms.  She denies any chest pain or shortness of breath.  She states that she is currently pain-free, but it was present just prior to arrival in the emergency department.    Past Medical History:  Diagnosis Date   GERD (gastroesophageal reflux disease)     Patient Active Problem List   Diagnosis Date Noted   Right knee pain 07/10/2020   Palpitation 07/08/2019   Advanced care planning/counseling discussion 01/22/2016   BMI 35.0-35.9,adult 07/20/2015   Shingles 05/04/2014   Essential hypertension, benign 08/24/2008   Osteopenia 04/14/2008   GERD 06/25/2007    Past Surgical History:  Procedure Laterality Date   ABDOMINAL HYSTERECTOMY       OB History   No obstetric history on file.     Family History  Problem Relation Age of Onset   Heart failure Mother    Stroke Father    Kidney disease Maternal Grandmother    Coronary artery disease Maternal Grandfather    Heart attack Maternal  Grandfather    Osteoporosis Paternal Grandfather     Social History   Tobacco Use   Smoking status: Never   Smokeless tobacco: Never  Vaping Use   Vaping Use: Never used  Substance Use Topics   Alcohol use: No    Alcohol/week: 0.0 standard drinks   Drug use: No    Home Medications Prior to Admission medications   Medication Sig Start Date End Date Taking? Authorizing Provider  cyclobenzaprine (FLEXERIL) 10 MG tablet Take 0.5 tablets (5 mg total) by mouth 2 (two) times daily as needed for muscle spasms. 06/03/21  Yes Claud Kelp, MD  Calcium Carbonate-Vitamin D (CALCIUM 600-D) 600-400 MG-UNIT per tablet Take 1 tablet by mouth daily.      [provider]  esomeprazole (NEXIUM) 20 MG capsule Take 20 mg by mouth daily at 12 noon.    [provider]  hydrochlorothiazide (HYDRODIURIL) 25 MG tablet TAKE 1 TABLET BY MOUTH EVERY DAY 07/30/20   Bedsole, Amy E, MD  ibuprofen (ADVIL,MOTRIN) 200 MG tablet Take 400 mg by mouth every 6 (six) hours as needed for moderate pain.    [provider]  Multiple Vitamin (MULTIVITAMIN) tablet Take 1 tablet by mouth daily.      [provider]  Simethicone (GAS RELIEF) 180 MG CAPS Take 180 mg by mouth daily as needed (for bloating).    [provider]  Allergies    Patient has no known allergies.  Review of Systems   Review of Systems  Constitutional:  Negative for chills and fever.  HENT:  Negative for ear pain and sore throat.   Eyes:  Negative for pain and visual disturbance.  Respiratory:  Negative for cough and shortness of breath.   Cardiovascular:  Negative for chest pain and palpitations.  Gastrointestinal:  Negative for abdominal pain and vomiting.  Genitourinary:  Negative for dysuria and hematuria.  Musculoskeletal:  Positive for arthralgias. Negative for back pain.  Skin:  Negative for color change and rash.  Neurological:  Negative for seizures and syncope.  All other systems reviewed and  are negative.  Physical Exam Updated Vital Signs BP (!) 143/83 (BP Location: Right Arm)   Pulse 74   Temp 97.9 F (36.6 C) (Oral)   Resp 18   SpO2 93%   Physical Exam Vitals and nursing note reviewed.  Constitutional:      General: She is not in acute distress.    Appearance: Normal appearance. She is well-developed. She is not ill-appearing or toxic-appearing.  HENT:     Head: Normocephalic and atraumatic.  Eyes:     Conjunctiva/sclera: Conjunctivae normal.  Cardiovascular:     Rate and Rhythm: Normal rate and regular rhythm.     Heart sounds: No murmur heard. Pulmonary:     Effort: Pulmonary effort is normal. No respiratory distress.     Breath sounds: Normal breath sounds.  Abdominal:     Palpations: Abdomen is soft.     Tenderness: There is no abdominal tenderness.  Musculoskeletal:        General: No swelling, tenderness or deformity.     Cervical back: Neck supple.     Comments: No tenderness to palpation over the midline cervical spine, no palpable step-offs or deformities.  Full range of motion of the left shoulder.  No pain with abduction resistance.  Negative empty can test.  No tenderness over the clavicles or AC joint.  No overlying rashes.  Skin:    General: Skin is warm and dry.     Findings: No rash.  Neurological:     Mental Status: She is alert.    ED Results / Procedures / Treatments   Labs (all labs ordered are listed, but only abnormal results are displayed) Labs Reviewed  BASIC METABOLIC PANEL - Abnormal; Notable for the following components:      Result Value   Sodium 131 (*)    Chloride 97 (*)    Glucose, Bld 105 (*)    All other components within normal limits  CBC - Abnormal; Notable for the following components:   RBC 5.12 (*)    All other components within normal limits  TROPONIN I (HIGH SENSITIVITY)  TROPONIN I (HIGH SENSITIVITY)    EKG EKG Interpretation  Date/Time:  Monday June 03 2021 10:07:42 EDT Ventricular Rate:  88 PR  Interval:  136 QRS Duration: 74 QT Interval:  370 QTC Calculation: 447 R Axis:   -7 Text Interpretation: Normal sinus rhythm Low voltage QRS Cannot rule out Anterior infarct , age undetermined no acute ST/T changes Confirmed by Sherwood Gambler [phone]) on 06/03/2021 10:18:22 AM  Radiology DG Chest 2 View  Result Date: 06/03/2021 CLINICAL DATA:  Chest pain. EXAM: CHEST - 2 VIEW COMPARISON:  07/13/2015 FINDINGS: Prominent left ventricular size, stable. Normal aortic and hilar contours. Blunting of the posterior costophrenic sulci which is also seen on prior. No edema or consolidation. No  pneumothorax. Thoracic dextroscoliosis. IMPRESSION: Stable compared to 2016.  No acute finding. Electronically Signed   By: Monte Fantasia M.D.   On: 06/03/2021 10:19    Procedures Procedures   Medications Ordered in ED Medications  acetaminophen (TYLENOL) tablet 1,000 mg (1,000 mg Oral Given 06/03/21 1042)  ibuprofen (ADVIL) tablet 400 mg (400 mg Oral Given 06/03/21 1042)  cyclobenzaprine (FLEXERIL) tablet 5 mg (5 mg Oral Given 06/03/21 1154)    ED Course  I have reviewed the triage vital signs and the nursing notes.  Pertinent labs & imaging results that were available during my care of the patient were reviewed by me and considered in my medical decision making (see chart for details).    MDM Rules/Calculators/A&P                           75 yo F with above PMHx presenting to the ED with left shoulder pain. Vitals reviewed, within acceptable limits. Physical exam notable for no rash to the left shoulder, no bony tenderness. DDX includes shingles, radiculopathy, sprain/strain, ACS. No overlying rash, could be early shingles but seems less likely since she is now pain free. No neck pain or parethesias to suggest cervical pathology. EKG shows sinus rhythm without STE or STD or TWI, no signs of acute ischemia. Delta troponins negative. CXR without pneumothorax. Overall, presentation seems most consistent with  MSK etiology of her symptoms. Although exact etiology unknown, it does not appear due to a medical or surgical emergency. Provided with analgesia here. I believe that she is stable for discharge from the ED. Will provide a short course of muscle relaxers. Encouraged close PCP follow up and gave strict return precautions.    Final Clinical Impression(s) / ED Diagnoses Final diagnoses:  Left arm pain    Rx / DC Orders ED Discharge Orders          Ordered    cyclobenzaprine (FLEXERIL) 10 MG tablet  2 times daily PRN        06/03/21 1316             Claud Kelp, MD 06/03/21 2115    Sherwood Gambler, MD 06/04/21 [phone]

## 2021-06-05 ENCOUNTER — Encounter: Payer: Self-pay | Admitting: Family Medicine

## 2021-06-05 ENCOUNTER — Ambulatory Visit (INDEPENDENT_AMBULATORY_CARE_PROVIDER_SITE_OTHER): Payer: Medicare HMO | Admitting: Family Medicine

## 2021-06-05 ENCOUNTER — Other Ambulatory Visit: Payer: Self-pay

## 2021-06-05 VITALS — BP 130/80 | HR 84 | Temp 98.3°F | Ht 61.0 in | Wt 193.2 lb

## 2021-06-05 DIAGNOSIS — M25512 Pain in left shoulder: Secondary | ICD-10-CM | POA: Diagnosis not present

## 2021-06-05 DIAGNOSIS — M7502 Adhesive capsulitis of left shoulder: Secondary | ICD-10-CM

## 2021-06-05 MED ORDER — TRAMADOL HCL 50 MG PO TABS: 50.0000 mg | ORAL_TABLET | Freq: Three times a day (TID) | ORAL | 0 refills | Status: DC | PRN

## 2021-06-05 MED ORDER — TRIAMCINOLONE ACETONIDE 40 MG/ML IJ SUSP
40.0000 mg | Freq: Once | INTRAMUSCULAR | Status: AC
  Administered 2021-06-05: 40 mg via INTRA_ARTICULAR

## 2021-06-05 NOTE — Progress Notes (Signed)
Suhaas Agena T. Meeah Totino, MD, [place] at [facility] [place], [address]  Phone: [phone]  FAX: [place] - 75 y.o. female  MRN [id number]  Date of Birth: 1945-12-29  Date: 06/05/2021  PCP: Jinny Sanders, MD  Referral: Jinny Sanders, MD  Chief Complaint  Patient presents with   Follow-up    ER Visit-Left Arm Pain    This visit occurred during the SARS-CoV-2 public health emergency.  Safety protocols were in place, including screening questions prior to the visit, additional usage of staff PPE, and extensive cleaning of exam room while observing appropriate contact time as indicated for disinfecting solutions.   Subjective:   Danielle Kane is a 75 y.o. very pleasant female patient with Body mass index is 36.51 kg/m. who presents with the following:  L arm pain: She describes pain in her arm that radiates down beyond the elbow.  She does not describe any numbness, tingling, or decreased sensation or focal intramuscular weakness.  She actually went to the emergency room on June 03, 2021, and there was some concern that this could be a cardiac process, but she did have negative troponins.  These symptoms have been present for about 1 week, and she has significant loss of motion in a T-shirt distribution.  No surg, neck, shoulder.   A lot of pain.  [facility] protocol Tramadol Shoulder injection Physical Therapy  Benadryl  Inj shoulder, L   Review of Systems is noted in the HPI, as appropriate   Objective:   BP 130/80   Pulse 84   Temp 98.3 F (36.8 C) (Temporal)   Ht '5\' 1"'$  (1.549 m)   Wt 193 lb 4 oz (87.7 kg)   SpO2 98%   BMI 36.51 kg/m    Shoulder: R and L Inspection: No muscle wasting or winging Ecchymosis/edema: neg  AC joint, scapula, clavicle: NT Cervical spine: NT, full ROM Spurling's: neg ABNORMAL SIDE TESTED: Left UNLESS OTHERWISE NOTED, THE CONTRALATERAL SIDE HAS  FULL RANGE OF MOTION. Abduction: 5/5, LIMITED TO 120 DEGREES Flexion: 5/5, LIMITED TO 120 DEGNO ROM  IR, lift-off: 5/5. TESTED AT 90 DEGREES OF ABDUCTION, LIMITED TO 0 DEGREES ER at neutral:  5/5, TESTED AT 90 DEGREES OF ABDUCTION, LIMITED TO 20 DEGREES AC crossover and compression: PAIN Drop Test: neg Empty Can: neg Supraspinatus insertion: NT Bicipital groove: NT ALL OTHER SPECIAL TESTING EQUIVOCAL GIVEN LOSS OF MOTION C5-T1 intact Sensation intact Grip 5/5    Radiology: DG Chest 2 View  Result Date: 06/03/2021 CLINICAL DATA:  Chest pain. EXAM: CHEST - 2 VIEW COMPARISON:  07/13/2015 FINDINGS: Prominent left ventricular size, stable. Normal aortic and hilar contours. Blunting of the posterior costophrenic sulci which is also seen on prior. No edema or consolidation. No pneumothorax. Thoracic dextroscoliosis. IMPRESSION: Stable compared to 2016.  No acute finding. Electronically Signed   By: Monte Fantasia M.D.   On: 06/03/2021 10:19    Assessment and Plan:     ICD-10-CM   1. Adhesive capsulitis of left shoulder  M75.02 triamcinolone acetonide (KENALOG-40) injection 40 mg    Ambulatory referral to Physical Therapy    2. Acute pain of left shoulder  M25.512 Ambulatory referral to Physical Therapy     The patient is an fairly severe pain.  Her motion is dramatically poor, but she has no injury.  Her strength is actually quite good and normal.  She does not have any symptoms with  movement or manipulation of the neck.  I reviewed with the patient a handout from [employer] and [place] regarding their condition and a set of home exercises.    Tramadol as needed for now.  Inject with Novocain and Kenalog. Formal PT.  Intraarticular Shoulder Aspiration/Injection Procedure Note Danielle Kane June 08, 1946 Date of procedure: 06/05/2021  Procedure: Large Joint Aspiration / Injection of Shoulder, Intraarticular, L Indications: Pain  Procedure Details Verbal consent  was obtained from the patient. Risks explained and contrasted with benefits and alternatives. Patient prepped with Chloraprep and Ethyl Chloride used for anesthesia. An intraarticular shoulder injection was performed using the posterior approach; needle placed into joint capsule without difficulty. The patient tolerated the procedure well and had decreased pain post injection. No complications. Injection: 9 cc of Lidocaine 1% and 1 mL Kenalog 40 mg. Needle: 21 gauge, 2 inch   Meds ordered this encounter  Medications   traMADol (ULTRAM) 50 MG tablet    Sig: Take 1 tablet (50 mg total) by mouth every 8 (eight) hours as needed for moderate pain.    Dispense:  20 tablet    Refill:  0   triamcinolone acetonide (KENALOG-40) injection 40 mg   There are no discontinued medications. Orders Placed This Encounter  Procedures   Ambulatory referral to Physical Therapy    Follow-up: Return in about 2 months (around 08/05/2021).  [employer] One speech-to-text software was used for transcription in this dictation.  Possible transcriptional errors can occur using Editor, commissioning.   Signed,  Maud Deed. Bailen Geffre, MD   Outpatient Encounter Medications as of 06/05/2021  Medication Sig   Calcium Carbonate-Vitamin D 600-400 MG-UNIT tablet Take 1 tablet by mouth daily.     cyclobenzaprine (FLEXERIL) 10 MG tablet Take 0.5 tablets (5 mg total) by mouth 2 (two) times daily as needed for muscle spasms.   esomeprazole (NEXIUM) 20 MG capsule Take 20 mg by mouth daily at 12 noon.   hydrochlorothiazide (HYDRODIURIL) 25 MG tablet TAKE 1 TABLET BY MOUTH EVERY DAY   ibuprofen (ADVIL,MOTRIN) 200 MG tablet Take 400 mg by mouth every 6 (six) hours as needed for moderate pain.   Multiple Vitamin (MULTIVITAMIN) tablet Take 1 tablet by mouth daily.     Simethicone 180 MG CAPS Take 180 mg by mouth daily as needed (for bloating).   traMADol (ULTRAM) 50 MG tablet Take 1 tablet (50 mg total) by mouth every 8 (eight) hours as  needed for moderate pain.   [EXPIRED] triamcinolone acetonide (KENALOG-40) injection 40 mg    No facility-administered encounter medications on file as of 06/05/2021.

## 2021-06-05 NOTE — Patient Instructions (Signed)
Insomnia:  Melatonin up to 10 mg can be taken 1 hour before sleep very safely every day  Antihistamines: Take 1 tablet of Benadryl before bed.   If that does not work, then it is ok to take 2

## 2021-06-11 ENCOUNTER — Other Ambulatory Visit: Payer: Self-pay

## 2021-06-11 ENCOUNTER — Ambulatory Visit: Payer: Medicare HMO | Attending: Family Medicine

## 2021-06-11 DIAGNOSIS — R208 Other disturbances of skin sensation: Secondary | ICD-10-CM | POA: Diagnosis not present

## 2021-06-11 DIAGNOSIS — M25512 Pain in left shoulder: Secondary | ICD-10-CM | POA: Diagnosis not present

## 2021-06-11 DIAGNOSIS — M25612 Stiffness of left shoulder, not elsewhere classified: Secondary | ICD-10-CM

## 2021-06-11 DIAGNOSIS — M6281 Muscle weakness (generalized): Secondary | ICD-10-CM | POA: Diagnosis not present

## 2021-06-11 NOTE — Therapy (Signed)
[place], [place], [address] Phone: [phone]   Fax:  [phone]  Physical Therapy Evaluation  Patient Details  Name: Danielle Kane MRN: [id number] Date of Birth: [birth year] Referring Provider (PT): Owens Loffler   Encounter Date: 06/11/2021   PT End of Session - 06/11/21 1558     Visit Number 1    Number of Visits 13    Date for PT Re-Evaluation 07/23/21    Authorization Type [employer] MCR    Progress Note Due on Visit 10    PT Start Time 1350    PT Stop Time 1445    PT Time Calculation (min) 55 min    Activity Tolerance Patient tolerated treatment well    Behavior During Therapy [facility] for tasks assessed/performed             Past Medical History:  Diagnosis Date   GERD (gastroesophageal reflux disease)     Past Surgical History:  Procedure Laterality Date   ABDOMINAL HYSTERECTOMY      There were no vitals filed for this visit.    Subjective Assessment - 06/11/21 1353     Subjective Pt reports primary c/o L posterior and lateral shoulder pain with radicular pain down the L forearm and N/T in all 5 fingertips. This occurred insidiously last Saturday after waking up in the morning. She reports that putting her hand over her head helped with the pain. She went to the ER the following Monday, where MI was ruled out with bloodwork and EKG. She then went to Dr. Edilia Bo in sports medicine the following Wednesday, where she reports that she was diagnosed with frozen shoulder. She was given a cortisone shot the same day for pain. She reports no injuries related to this problem. She also denies unexplained weight gain/ loss, dizziness, nausea, vomiting, and unrelenting night pain, although she has been sleeping in a recliner the past week due to pain. Aggravating factors include sitting and general movement. Easing factors include ibuprofen, Tylenol, and a heating pad.    Limitations  Sitting;Lifting;Standing;House hold activities    How long can you sit comfortably? 30 minutes    How long can you stand comfortably? Unlimited    How long can you walk comfortably? Unlimited    Patient Stated Goals Sleeping in bed, moving arm without pain    Currently in Pain? Yes    Pain Score 6     Pain Location Shoulder    Pain Orientation Left    Pain Descriptors / Indicators Sharp;Shooting;Tingling    Pain Type Acute pain    Pain Radiating Towards L forearm, fingertips    Pain Onset 1 to 4 weeks ago    Pain Frequency Intermittent    Aggravating Factors  Movement, sitting    Pain Relieving Factors Tylenol, ibuprofen, heating pad, placing arm in shoulder adduction/ IR    Effect of Pain on Daily Activities Difficulty with washing dishes, doing laundry    Multiple Pain Sites No                [facility] PT Assessment - 06/11/21 0001       Assessment   Medical Diagnosis Adhesive capsulitis of left shoulder (M75.02), Acute pain of left shoulder (M25.512)    Referring Provider (PT) Copland, Spencer    Onset Date/Surgical Date 06/01/21    Hand Dominance Right    Next MD Visit September    Prior Therapy No      Precautions  Precautions None      Restrictions   Weight Bearing Restrictions No      Balance Screen   Has the patient fallen in the past 6 months No    Has the patient had a decrease in activity level because of a fear of falling?  No    Is the patient reluctant to leave their home because of a fear of falling?  No      Home Environment   Living Environment Private residence    Living Arrangements Alone    Available Help at Discharge Family    Type of [place] to enter    Entrance Stairs-Number of Steps 3    Entrance Stairs-Rails Right;Left    Home Layout Two level   Pt lives on 1st floor, doesn't go upstairs often   Alternate Level Stairs-Number of Steps 12    Alternate Level Stairs-Rails Right    Home Equipment None      Prior  Function   Level of Independence Independent    Vocation Full time employment    Vocation Requirements sitting most of day, typing    Leisure Spend time with family      Observation/Other Assessments   Focus on Therapeutic Outcomes (FOTO)  53%, predicted 69% by visit 11      Sensation   Light Touch Impaired by gross assessment   C6-T1 diminished sensation on L vs R     AROM   Right Shoulder Flexion 185 Degrees    Right Shoulder ABduction 180 Degrees    Right Shoulder Internal Rotation 75 Degrees    Right Shoulder External Rotation 90 Degrees    Left Shoulder Flexion 145 Degrees   with pain   Left Shoulder ABduction 170 Degrees    Left Shoulder Internal Rotation 70 Degrees    Left Shoulder External Rotation 78 Degrees   with pain     PROM   Left Shoulder Flexion 175 Degrees   with pain   Left Shoulder ABduction 185 Degrees   with pain   Left Shoulder Internal Rotation 85 Degrees   with "pulling feeling"   Left Shoulder External Rotation 85 Degrees   with pain     Strength   Overall Strength Comments LT, MT, lat: R: 4/5; L: 3/5    Right Shoulder Flexion 5/5    Right Shoulder Extension 5/5    Right Shoulder ABduction 5/5    Right Shoulder Internal Rotation 5/5    Right Shoulder External Rotation 5/5    Left Shoulder Flexion 4+/5   with pain   Left Shoulder Extension 5/5    Left Shoulder ABduction 5/5    Left Shoulder Internal Rotation 5/5    Left Shoulder External Rotation 4+/5   "pulling feeling"   Right Elbow Flexion 5/5    Right Elbow Extension 5/5    Left Elbow Flexion 5/5    Left Elbow Extension 4+/5      Flexibility   Soft Tissue Assessment /Muscle Length --   UT and levator scap WNL BIL     Palpation   Palpation comment GHJ mobility WNL BIL; painful with L AP/PA/Inf glides   cervical passive accessories WNL     other    Findings Negative    Comment Cervical Compression      other    Findings Negative    Comment ULNTT (median, ulnar, radial)      Neer  Impingement test    Findings  Negative      Hawkins-Kennedy test   Findings Negative      Lift-Off test   Findings Negative      Lag time at 0 degrees   Findings Negative      Lag signs at 90 degrees    Findings Negative      Drop Arm test   Findings Negative      Painful Arc of Motion   Findings Negative                        Objective measurements completed on examination: See above findings.               PT Education - 06/11/21 1556     Education Details Pt educated on etiology and underlying physiology behind adhesive capsulitis, along with predicted timeline of progression. Also educated on proper form with newly added exercises.    Person(s) Educated Patient    Methods Explanation;Demonstration;Handout    Comprehension Verbalized understanding;Returned demonstration              PT Short Term Goals - 06/11/21 1652       PT SHORT TERM GOAL #1   Title Pt will report understanding and adherence of her HEP in order to promote independence in the management of her primary impairments.    Baseline HEP given at baseline    Time 3    Period Weeks    Status New    Target Date 07/02/21               PT Long Term Goals - 06/11/21 1653       PT LONG TERM GOAL #1   Title Pt will achieve a FOTO score of 69% in order to demonstrate improved functional ability in regard to her shoulder impairments.    Baseline 53%    Time 6    Period Weeks    Status New    Target Date 07/23/21      PT LONG TERM GOAL #2   Title Pt will demonstrate L shoulder flexion >165 degrees in order to brush her hair without limitation.    Baseline 145 degrees    Time 6    Period Weeks    Status New    Target Date 07/23/21      PT LONG TERM GOAL #3   Title Pt will report 0/10 pain when reaching overhead in order to put dishes away without limitation.    Baseline 5/10 pain at rest, increased with overhead movement    Time 6    Period Weeks    Status  New    Target Date 07/23/21      PT LONG TERM GOAL #4   Title Pt will report ability to sit >1 hour with 0-2/10 pain in order to complete her job duties without limitation.    Baseline Pt experiences increased pain after 30 minutes of sitting    Time 6    Period Weeks    Status New    Target Date 07/23/21      PT LONG TERM GOAL #5   Title Pt will demonstrate L shoulder strength of 5/5 in all planes of manual muscle testing in order to progress UE exercise regimen without limitation.    Baseline See flowsheet    Time 6    Period Weeks    Status New    Target Date 07/23/21  Plan - 06/11/21 1559     Clinical Impression Statement Pt is a pleasant 75yo F who presents with primary c/o L shoulder pain and stiffness of insidious onset starting about 10 days ago. Upon assessment, her primary impairments include diminished L shoulder AROM, weak L shoulder abduction and ER, pain with L shoulder active and passive ROM, weak parascapular musculature (L>R), impaired L UE sensation, and painful L GHJ passive accessories. Her sxs are most concordant with her referring diagnosis of acute adhesive capsulitis. Due to her L shoulder PROM being WNL, but AROM being limited and painful in a capsular pattern, it is likely that the pt is in the freezing stage of frozen shoulder. The likelihood increases with negative cervical and rotator cuff testing. She will benefit from skilled PT to address her primary impairments and return to her prior level of function without limitation.    Personal Factors and Comorbidities Age;Fitness    Examination-Activity Limitations Bathing;Transfers;Reach Overhead;Sit;Hygiene/Grooming;Lift    Examination-Participation Restrictions Laundry;Cleaning;Occupation    Stability/Clinical Decision Making Evolving/Moderate complexity    Clinical Decision Making Moderate    Rehab Potential Good    PT Frequency 2x / week    PT Duration 6 weeks    PT  Treatment/Interventions ADLs/Self Care Home Management;Aquatic Therapy;Biofeedback;Cryotherapy;Moist Heat;Therapeutic activities;Therapeutic exercise;Neuromuscular re-education;Manual techniques;Dry needling;Joint Manipulations;Passive range of motion;Taping;Patient/family education    PT Next Visit Plan Progress L shoulder AROM/ strengthening and scpaular strengthening    PT Home Exercise Plan [id number]    Consulted and Agree with Plan of Care Patient             Patient will benefit from skilled therapeutic intervention in order to improve the following deficits and impairments:  Decreased activity tolerance, Decreased strength, Decreased mobility, Impaired sensation, Postural dysfunction, Improper body mechanics, Hypomobility, Pain, Impaired UE functional use, Decreased range of motion  Visit Diagnosis: Acute pain of left shoulder  Stiffness of left shoulder, not elsewhere classified  Muscle weakness (generalized)  Impaired sensation to light touch     Problem List Patient Active Problem List   Diagnosis Date Noted   Right knee pain 07/10/2020   Palpitation 07/08/2019   Advanced care planning/counseling discussion 01/22/2016   BMI 35.0-35.9,adult 07/20/2015   Shingles 05/04/2014   Essential hypertension, benign 08/24/2008   Osteopenia 04/14/2008   GERD 06/25/2007    Vanessa Cape Neddick, PT, DPT 06/11/21 5:03 PM   [facility] [address] [place], [place], [address] Phone: [phone]   Fax:  [phone]  Name: Danielle Kane MRN: [id number] Date of Birth: 1946/04/22

## 2021-06-11 NOTE — Patient Instructions (Signed)
  [id number]

## 2021-06-19 ENCOUNTER — Other Ambulatory Visit: Payer: Self-pay

## 2021-06-19 ENCOUNTER — Ambulatory Visit: Payer: Medicare HMO

## 2021-06-19 DIAGNOSIS — M6281 Muscle weakness (generalized): Secondary | ICD-10-CM

## 2021-06-19 DIAGNOSIS — M25512 Pain in left shoulder: Secondary | ICD-10-CM | POA: Diagnosis not present

## 2021-06-19 DIAGNOSIS — R208 Other disturbances of skin sensation: Secondary | ICD-10-CM | POA: Diagnosis not present

## 2021-06-19 DIAGNOSIS — M25612 Stiffness of left shoulder, not elsewhere classified: Secondary | ICD-10-CM | POA: Diagnosis not present

## 2021-06-19 NOTE — Therapy (Signed)
[place], [place], [address] Phone: [phone]   Fax:  [phone]  Physical Therapy Treatment  Patient Details  Name: Danielle Kane MRN: [id number] Date of Birth: May 17, 1946 Referring Provider (PT): Owens Loffler   Encounter Date: 06/19/2021   PT End of Session - 06/19/21 1509     Visit Number 2    Number of Visits 13    Date for PT Re-Evaluation 07/23/21    Authorization Type [employer] MCR    Progress Note Due on Visit 10    PT Start Time [id number]    PT Stop Time 1530    PT Time Calculation (min) 45 min    Activity Tolerance Patient tolerated treatment well    Behavior During Therapy [facility] for tasks assessed/performed             Past Medical History:  Diagnosis Date   GERD (gastroesophageal reflux disease)     Past Surgical History:  Procedure Laterality Date   ABDOMINAL HYSTERECTOMY      There were no vitals filed for this visit.   Subjective Assessment - 06/19/21 1440     Subjective Pt reports feeling better since starting PT. She reports no pain today, only reporting tingling down her L upper arm and numbness in her L fingertips with digits 1 and 2 the most involved. She reports doing her exercises daily, reporting increased L UE sxs when performing horizontal abduction.    Currently in Pain? No/denies    Pain Score 0-No pain                               [facility] Adult PT Treatment/Exercise - 06/19/21 0001       Neck Exercises: Standing   Neck Retraction Other (comment)   2x10 with ball at wall with cervical rotation   Upper Extremity D2 Flexion;20 reps    Theraband Level (UE D2) Level 1 (Yellow)      Neck Exercises: Seated   Upper Extremity D2 --    Theraband Level (UE D2) --    Other Seated Exercise Median nerve glides on L 2x20      Shoulder Exercises: Standing   Horizontal ABduction Strengthening   2x10   Theraband Level (Shoulder Horizontal ABduction)  Level 2 (Red)    Flexion Strengthening   2x10   Theraband Level (Shoulder Flexion) Level 2 (Red)    Extension Strengthening   2x10   Theraband Level (Shoulder Extension) Level 2 (Red)    Other Standing Exercises Low trap setting with lift off at wall 2x8      Manual Therapy   Manual Therapy Soft tissue mobilization    Soft tissue mobilization Efflourage/ MTPR to BIL traps/ cervical paraspinals                    PT Education - 06/19/21 1509     Education Details Pt educated on proper form when performing new exercises.    Person(s) Educated Patient    Methods Explanation;Demonstration;Handout    Comprehension Verbalized understanding;Returned demonstration              PT Short Term Goals - 06/19/21 1526       PT SHORT TERM GOAL #1   Title Pt will report understanding and adherence of her HEP in order to promote independence in the management of her primary impairments.    Baseline HEP given at baseline  Time 3    Period Weeks    Status Achieved    Target Date 07/02/21               PT Long Term Goals - 06/11/21 1653       PT LONG TERM GOAL #1   Title Pt will achieve a FOTO score of 69% in order to demonstrate improved functional ability in regard to her shoulder impairments.    Baseline 53%    Time 6    Period Weeks    Status New    Target Date 07/23/21      PT LONG TERM GOAL #2   Title Pt will demonstrate L shoulder flexion >165 degrees in order to brush her hair without limitation.    Baseline 145 degrees    Time 6    Period Weeks    Status New    Target Date 07/23/21      PT LONG TERM GOAL #3   Title Pt will report 0/10 pain when reaching overhead in order to put dishes away without limitation.    Baseline 5/10 pain at rest, increased with overhead movement    Time 6    Period Weeks    Status New    Target Date 07/23/21      PT LONG TERM GOAL #4   Title Pt will report ability to sit >1 hour with 0-2/10 pain in order to complete  her job duties without limitation.    Baseline Pt experiences increased pain after 30 minutes of sitting    Time 6    Period Weeks    Status New    Target Date 07/23/21      PT LONG TERM GOAL #5   Title Pt will demonstrate L shoulder strength of 5/5 in all planes of manual muscle testing in order to progress UE exercise regimen without limitation.    Baseline See flowsheet    Time 6    Period Weeks    Status New    Target Date 07/23/21                   Plan - 06/19/21 1520     Clinical Impression Statement Pt responded well to all interventions today, demonstrating good posture and no increase in pain with all selected exercises. She responded well to manual trigger point release to her BIL traps, with reports of feeling much "looser" upon leaving clinic. Pt's presentation today is more indicative of cervical radiulopathy rather than adhesive capulitis with radicular sxs in a median nerve distribution on her L UE which were improved following median nerve glides. She has not experienced shoulder pain since her initial evaluation. She will continue to benefit from skilled PT to address her primary impairments and return to her prior level of function without limitation.    Personal Factors and Comorbidities Age;Fitness    Examination-Activity Limitations Bathing;Transfers;Reach Overhead;Sit;Hygiene/Grooming;Lift    Examination-Participation Restrictions Laundry;Cleaning;Occupation    Stability/Clinical Decision Making Evolving/Moderate complexity    Clinical Decision Making Moderate    Rehab Potential Good    PT Frequency 2x / week    PT Duration 6 weeks    PT Treatment/Interventions ADLs/Self Care Home Management;Aquatic Therapy;Biofeedback;Cryotherapy;Moist Heat;Therapeutic activities;Therapeutic exercise;Neuromuscular re-education;Manual techniques;Dry needling;Joint Manipulations;Passive range of motion;Taping;Patient/family education    PT Next Visit Plan Progress L  shoulder AROM/ strengthening and DNF/ scpaular strengthening    PT Home Exercise Plan [id number]    Consulted and Agree with Plan of Care Patient  Patient will benefit from skilled therapeutic intervention in order to improve the following deficits and impairments:  Decreased activity tolerance, Decreased strength, Decreased mobility, Impaired sensation, Postural dysfunction, Improper body mechanics, Hypomobility, Pain, Impaired UE functional use, Decreased range of motion  Visit Diagnosis: Acute pain of left shoulder  Stiffness of left shoulder, not elsewhere classified  Muscle weakness (generalized)  Impaired sensation to light touch     Problem List Patient Active Problem List   Diagnosis Date Noted   Right knee pain 07/10/2020   Palpitation 07/08/2019   Advanced care planning/counseling discussion 01/22/2016   BMI 35.0-35.9,adult 07/20/2015   Shingles 05/04/2014   Essential hypertension, benign 08/24/2008   Osteopenia 04/14/2008   GERD 06/25/2007    Vanessa Traver, PT, DPT 06/19/21 3:34 PM   [place] [facility] [address] [place], [place], [address] Phone: [phone]   Fax:  [phone]  Name: BRYDIE MCWHINNEY MRN: [id number] Date of Birth: 1946/10/07

## 2021-06-19 NOTE — Patient Instructions (Signed)
  [id number]

## 2021-06-24 ENCOUNTER — Other Ambulatory Visit: Payer: Self-pay

## 2021-06-24 ENCOUNTER — Ambulatory Visit: Payer: Medicare HMO

## 2021-06-24 DIAGNOSIS — M6281 Muscle weakness (generalized): Secondary | ICD-10-CM | POA: Diagnosis not present

## 2021-06-24 DIAGNOSIS — M25512 Pain in left shoulder: Secondary | ICD-10-CM

## 2021-06-24 DIAGNOSIS — M25612 Stiffness of left shoulder, not elsewhere classified: Secondary | ICD-10-CM

## 2021-06-24 DIAGNOSIS — R208 Other disturbances of skin sensation: Secondary | ICD-10-CM | POA: Diagnosis not present

## 2021-06-24 NOTE — Therapy (Signed)
[place], [place], [address] Phone: [phone]   Fax:  [phone]  Physical Therapy Treatment  Patient Details  Name: Danielle Kane MRN: [id number] Date of Birth: [birth year] Referring Provider (PT): Owens Loffler   Encounter Date: 06/24/2021   PT End of Session - 06/24/21 1514     Visit Number 3    Number of Visits 13    Date for PT Re-Evaluation 07/23/21    Authorization Type [employer] MCR    Progress Note Due on Visit 10    PT Start Time [id number]    PT Stop Time 1530    PT Time Calculation (min) 45 min    Activity Tolerance Patient tolerated treatment well    Behavior During Therapy [facility] for tasks assessed/performed             Past Medical History:  Diagnosis Date   GERD (gastroesophageal reflux disease)     Past Surgical History:  Procedure Laterality Date   ABDOMINAL HYSTERECTOMY      There were no vitals filed for this visit.   Subjective Assessment - 06/24/21 1439     Subjective Pt reports that she may have "overdone it" over the weekend with extracurricular activities. She states she had increased pain this morning, which has since subsided. She reports adherence to her HEP, performing her exercises daily. She also reports that she is moving her desk at work to make it more ergonomic.    Currently in Pain? Yes    Pain Score 4     Pain Location Arm    Pain Orientation Left;Upper    Pain Descriptors / Indicators Sore    Pain Type Acute pain    Pain Onset 1 to 4 weeks ago    Pain Frequency Intermittent                               [facility] Adult PT Treatment/Exercise - 06/24/21 0001       Neck Exercises: Standing   Other Standing Exercises Low trap setting with lift off at wall 2x8      Shoulder Exercises: Supine   Protraction Strengthening   2x10   Protraction Weight (lbs) 1    Other Supine Exercises With rolled pillow along thoracic spine, chest flies  with 1# DB's to table 2x10      Shoulder Exercises: Seated   Row Strengthening   low row/ high row super set 2x10 each   Row Weight (lbs) 15    Other Seated Exercises Lat pulldown 15# 2x10      Shoulder Exercises: Prone   Retraction Strengthening   Cervical retraction with alternating shoulder flexion 2x10 BIL     Manual Therapy   Manual Therapy Soft tissue mobilization    Soft tissue mobilization Efflourage/ MTPR to BIL traps/ cervical paraspinals                    PT Education - 06/24/21 1514     Education Details Pt educated on proper form when performing new exercises.    Person(s) Educated Patient    Methods Explanation;Demonstration;Handout    Comprehension Verbalized understanding;Returned demonstration              PT Short Term Goals - 06/19/21 1526       PT SHORT TERM GOAL #1   Title Pt will report understanding and adherence of her HEP in order  to promote independence in the management of her primary impairments.    Baseline HEP given at baseline    Time 3    Period Weeks    Status Achieved    Target Date 07/02/21               PT Long Term Goals - 06/11/21 1653       PT LONG TERM GOAL #1   Title Pt will achieve a FOTO score of 69% in order to demonstrate improved functional ability in regard to her shoulder impairments.    Baseline 53%    Time 6    Period Weeks    Status New    Target Date 07/23/21      PT LONG TERM GOAL #2   Title Pt will demonstrate L shoulder flexion >165 degrees in order to brush her hair without limitation.    Baseline 145 degrees    Time 6    Period Weeks    Status New    Target Date 07/23/21      PT LONG TERM GOAL #3   Title Pt will report 0/10 pain when reaching overhead in order to put dishes away without limitation.    Baseline 5/10 pain at rest, increased with overhead movement    Time 6    Period Weeks    Status New    Target Date 07/23/21      PT LONG TERM GOAL #4   Title Pt will report  ability to sit >1 hour with 0-2/10 pain in order to complete her job duties without limitation.    Baseline Pt experiences increased pain after 30 minutes of sitting    Time 6    Period Weeks    Status New    Target Date 07/23/21      PT LONG TERM GOAL #5   Title Pt will demonstrate L shoulder strength of 5/5 in all planes of manual muscle testing in order to progress UE exercise regimen without limitation.    Baseline See flowsheet    Time 6    Period Weeks    Status New    Target Date 07/23/21                   Plan - 06/24/21 1517     Clinical Impression Statement Pt responded well to all interventions today, demonstrating good posture and no increase in pain with all selected exercises. She againresponded well to manual trigger point release to her BIL traps, with decrease in pain from 4/10 to 2/10 following the intervention. She will continue to benefit from skilled PT to address her primary impairments and return to her prior level of function without limitation.    Personal Factors and Comorbidities Age;Fitness    Examination-Activity Limitations Bathing;Transfers;Reach Overhead;Sit;Hygiene/Grooming;Lift    Examination-Participation Restrictions Laundry;Cleaning;Occupation    Stability/Clinical Decision Making Evolving/Moderate complexity    Clinical Decision Making Moderate    Rehab Potential Good    PT Frequency 2x / week    PT Duration 6 weeks    PT Treatment/Interventions ADLs/Self Care Home Management;Aquatic Therapy;Biofeedback;Cryotherapy;Moist Heat;Therapeutic activities;Therapeutic exercise;Neuromuscular re-education;Manual techniques;Dry needling;Joint Manipulations;Passive range of motion;Taping;Patient/family education    PT Next Visit Plan Progress L shoulder AROM/ strengthening and DNF/ scpaular strengthening    PT Home Exercise Plan [id number]    Consulted and Agree with Plan of Care Patient             Patient will benefit from skilled therapeutic  intervention in order  to improve the following deficits and impairments:  Decreased activity tolerance, Decreased strength, Decreased mobility, Impaired sensation, Postural dysfunction, Improper body mechanics, Hypomobility, Pain, Impaired UE functional use, Decreased range of motion  Visit Diagnosis: Acute pain of left shoulder  Stiffness of left shoulder, not elsewhere classified  Muscle weakness (generalized)  Impaired sensation to light touch     Problem List Patient Active Problem List   Diagnosis Date Noted   Right knee pain 07/10/2020   Palpitation 07/08/2019   Advanced care planning/counseling discussion 01/22/2016   BMI 35.0-35.9,adult 07/20/2015   Shingles 05/04/2014   Essential hypertension, benign 08/24/2008   Osteopenia 04/14/2008   GERD 06/25/2007    Vanessa [place], PT, DPT 06/24/21 3:28 PM    [facility] [address] [place], [place], [address] Phone: [phone]   Fax:  [phone]  Name: SHAMECA NORGREN MRN: [id number] Date of Birth: 01-Sep-1946

## 2021-06-24 NOTE — Patient Instructions (Signed)
  [id number]

## 2021-06-28 ENCOUNTER — Ambulatory Visit: Payer: Medicare HMO

## 2021-06-28 ENCOUNTER — Other Ambulatory Visit: Payer: Self-pay

## 2021-06-28 DIAGNOSIS — M25612 Stiffness of left shoulder, not elsewhere classified: Secondary | ICD-10-CM | POA: Diagnosis not present

## 2021-06-28 DIAGNOSIS — M6281 Muscle weakness (generalized): Secondary | ICD-10-CM

## 2021-06-28 DIAGNOSIS — R208 Other disturbances of skin sensation: Secondary | ICD-10-CM | POA: Diagnosis not present

## 2021-06-28 DIAGNOSIS — M25512 Pain in left shoulder: Secondary | ICD-10-CM

## 2021-06-28 NOTE — Patient Instructions (Signed)
Instructed to independently progress HEP.

## 2021-06-28 NOTE — Therapy (Signed)
[place], [place], [address] Phone: [phone]   Fax:  [phone]  Physical Therapy Treatment  Patient Details  Name: Danielle Kane MRN: [id number] Date of Birth: 1946-05-20 Referring Provider (PT): Owens Loffler   Encounter Date: 06/28/2021   PT End of Session - 06/28/21 1003     Visit Number 4    Number of Visits 13    Date for PT Re-Evaluation 07/23/21    Authorization Type [employer] MCR    Progress Note Due on Visit 10    PT Start Time 2115    PT Stop Time 2200    PT Time Calculation (min) 45 min    Activity Tolerance Patient tolerated treatment well    Behavior During Therapy [facility] for tasks assessed/performed             Past Medical History:  Diagnosis Date   GERD (gastroesophageal reflux disease)     Past Surgical History:  Procedure Laterality Date   ABDOMINAL HYSTERECTOMY      There were no vitals filed for this visit.   Subjective Assessment - 06/28/21 0920     Subjective Pt reports feeling well today with no pain. She reports being adherent with her HEP. She brings her grandaughter today, who is interested in pursuing a career in PT.    How long can you sit comfortably? Unlimited    How long can you stand comfortably? Unlimited    How long can you walk comfortably? Unlimited    Currently in Pain? No/denies    Pain Score 0-No pain                [facility] PT Assessment - 06/28/21 0001       AROM   Left Shoulder Flexion 166 Degrees    Left Shoulder Internal Rotation 91 Degrees    Left Shoulder External Rotation 86 Degrees      Strength   Left Shoulder Flexion 4+/5    Left Shoulder External Rotation 5/5                           [facility] Adult PT Treatment/Exercise - 06/28/21 0001       Neck Exercises: Standing   Other Standing Exercises Low trap setting with lift off at wall 2x8    Other Standing Exercises Cervical retraction with ball at wall  with rotation 2x20      Shoulder Exercises: Seated   Other Seated Exercises Lat pulldown 25# 3x10      Shoulder Exercises: Prone   Other Prone Exercises Quadruped bird dogs 3x10 BIL      Shoulder Exercises: Standing   Other Standing Exercises PNF D2 shoulder flexion with Body Blade 2x10                    PT Education - 06/28/21 1003     Education Details Educated on how to progress HEP, including repetitions and resistance.    Person(s) Educated Patient    Methods Explanation    Comprehension Verbalized understanding              PT Short Term Goals - 06/19/21 1526       PT SHORT TERM GOAL #1   Title Pt will report understanding and adherence of her HEP in order to promote independence in the management of her primary impairments.    Baseline HEP given at baseline    Time 3  Period Weeks    Status Achieved    Target Date 07/02/21               PT Long Term Goals - 06/28/21 1004       PT LONG TERM GOAL #1   Title Pt will achieve a FOTO score of 69% in order to demonstrate improved functional ability in regard to her shoulder impairments.    Baseline 53%    Time 6    Period Weeks    Status New      PT LONG TERM GOAL #2   Title Pt will demonstrate L shoulder flexion >165 degrees in order to brush her hair without limitation.    Baseline 166d    Time 6    Period Weeks    Status Achieved      PT LONG TERM GOAL #3   Title Pt will report 0/10 pain when reaching overhead in order to put dishes away without limitation.    Baseline Achieved    Time 6    Period Weeks    Status Achieved      PT LONG TERM GOAL #4   Title Pt will report ability to sit >1 hour with 0-2/10 pain in order to complete her job duties without limitation.    Baseline Unlimited in sitting    Time 6    Period Weeks    Status Achieved      PT LONG TERM GOAL #5   Title Pt will demonstrate L shoulder strength of 5/5 in all planes of manual muscle testing in order to  progress UE exercise regimen without limitation.    Baseline See flowsheet    Time 6    Period Weeks    Status On-going                   Plan - 06/28/21 1004     Clinical Impression Statement Pt responded well to all interventions today, demonstrating good posture and no increase in pain with all selected exercises. She leaves with 0/10 pain and reports of minor fatigue.  She will continue to benefit from skilled PT to address her primary impairments and return to her prior level of function without limitation.    Personal Factors and Comorbidities Age;Fitness    Examination-Activity Limitations Bathing;Transfers;Reach Overhead;Sit;Hygiene/Grooming;Lift    Examination-Participation Restrictions Laundry;Cleaning;Occupation    Stability/Clinical Decision Making Evolving/Moderate complexity    Clinical Decision Making Moderate    Rehab Potential Good    PT Frequency 2x / week    PT Duration 6 weeks    PT Treatment/Interventions ADLs/Self Care Home Management;Aquatic Therapy;Biofeedback;Cryotherapy;Moist Heat;Therapeutic activities;Therapeutic exercise;Neuromuscular re-education;Manual techniques;Dry needling;Joint Manipulations;Passive range of motion;Taping;Patient/family education    PT Next Visit Plan Progress L shoulder AROM/ strengthening and DNF/ scpaular strengthening    PT Home Exercise Plan [id number]    Consulted and Agree with Plan of Care Patient             Patient will benefit from skilled therapeutic intervention in order to improve the following deficits and impairments:  Decreased activity tolerance, Decreased strength, Decreased mobility, Impaired sensation, Postural dysfunction, Improper body mechanics, Hypomobility, Pain, Impaired UE functional use, Decreased range of motion  Visit Diagnosis: Acute pain of left shoulder  Stiffness of left shoulder, not elsewhere classified  Muscle weakness (generalized)  Impaired sensation to light  touch     Problem List Patient Active Problem List   Diagnosis Date Noted   Right knee pain 07/10/2020   Palpitation  07/08/2019   Advanced care planning/counseling discussion 01/22/2016   BMI 35.0-35.9,adult 07/20/2015   Shingles 05/04/2014   Essential hypertension, benign 08/24/2008   Osteopenia 04/14/2008   GERD 06/25/2007    Vanessa [place], PT, DPT 06/28/21 10:06 AM   [facility] [facility] [address] [place], [place], [address] Phone: [phone]   Fax:  [phone]  Name: Danielle Kane MRN: [id number] Date of Birth: [birth year]

## 2021-07-01 ENCOUNTER — Ambulatory Visit: Payer: Medicare HMO

## 2021-07-04 ENCOUNTER — Telehealth: Payer: Self-pay | Admitting: Family Medicine

## 2021-07-04 DIAGNOSIS — I10 Essential (primary) hypertension: Secondary | ICD-10-CM

## 2021-07-04 NOTE — Telephone Encounter (Signed)
-----   Message from Cloyd Stagers, RT sent at 06/17/2021  2:58 PM EDT ----- Regarding: Lab Orders for Friday 9.2.2022 Please place lab orders for Friday 9.2.2022, office visit for physical on Friday 9.9.2022 Thank you, Dyke Maes RT(R)

## 2021-07-05 ENCOUNTER — Other Ambulatory Visit: Payer: Self-pay

## 2021-07-05 ENCOUNTER — Other Ambulatory Visit (INDEPENDENT_AMBULATORY_CARE_PROVIDER_SITE_OTHER): Payer: Medicare HMO

## 2021-07-05 DIAGNOSIS — I10 Essential (primary) hypertension: Secondary | ICD-10-CM | POA: Diagnosis not present

## 2021-07-05 LAB — COMPREHENSIVE METABOLIC PANEL
ALT: 14 U/L (ref 0–35)
AST: 15 U/L (ref 0–37)
Albumin: 3.7 g/dL (ref 3.5–5.2)
Alkaline Phosphatase: 48 U/L (ref 39–117)
BUN: 12 mg/dL (ref 6–23)
CO2: 30 mEq/L (ref 19–32)
Calcium: 9.4 mg/dL (ref 8.4–10.5)
Chloride: 101 mEq/L (ref 96–112)
Creatinine, Ser: 0.89 mg/dL (ref 0.40–1.20)
GFR: 63.4 mL/min (ref >60.00)
Glucose, Bld: 90 mg/dL (ref 70–99)
Potassium: 4.3 mEq/L (ref 3.5–5.1)
Sodium: 137 mEq/L (ref 135–145)
Total Bilirubin: 0.9 mg/dL (ref 0.2–1.2)
Total Protein: 7 g/dL (ref 6.0–8.3)

## 2021-07-05 LAB — LIPID PANEL
Cholesterol: 137 mg/dL (ref 0–200)
HDL: 48.2 mg/dL (ref >39.00)
LDL Cholesterol: 74 mg/dL (ref 0–99)
NonHDL: 88.88
Total CHOL/HDL Ratio: 3
Triglycerides: 74 mg/dL (ref 0.0–149.0)
VLDL: 14.8 mg/dL (ref 0.0–40.0)

## 2021-07-05 NOTE — Progress Notes (Signed)
No critical labs need to be addressed urgently. We will discuss labs in detail at upcoming office visit.   

## 2021-07-10 ENCOUNTER — Other Ambulatory Visit: Payer: Self-pay

## 2021-07-10 ENCOUNTER — Ambulatory Visit: Payer: Medicare HMO | Attending: Family Medicine

## 2021-07-10 DIAGNOSIS — M25512 Pain in left shoulder: Secondary | ICD-10-CM | POA: Diagnosis not present

## 2021-07-10 DIAGNOSIS — R208 Other disturbances of skin sensation: Secondary | ICD-10-CM | POA: Insufficient documentation

## 2021-07-10 DIAGNOSIS — M6281 Muscle weakness (generalized): Secondary | ICD-10-CM | POA: Diagnosis not present

## 2021-07-10 DIAGNOSIS — M25612 Stiffness of left shoulder, not elsewhere classified: Secondary | ICD-10-CM | POA: Diagnosis not present

## 2021-07-10 NOTE — Therapy (Signed)
[place], [place], [address] Phone: [phone]   Fax:  [phone]  Physical Therapy Treatment  Patient Details  Name: Danielle Kane MRN: [id number] Date of Birth: 26-Dec-1945 Referring Provider (PT): Owens Loffler   Encounter Date: 07/10/2021   PT End of Session - 07/10/21 1434     Visit Number 5    Number of Visits 13    Date for PT Re-Evaluation 07/23/21    Authorization Type [employer] MCR    Progress Note Due on Visit 10    PT Start Time 1400    PT Stop Time 1440    PT Time Calculation (min) 40 min    Activity Tolerance Patient tolerated treatment well    Behavior During Therapy [facility] for tasks assessed/performed             Past Medical History:  Diagnosis Date   GERD (gastroesophageal reflux disease)     Past Surgical History:  Procedure Laterality Date   ABDOMINAL HYSTERECTOMY      There were no vitals filed for this visit.   Subjective Assessment - 07/10/21 1358     Subjective Pt reports experiencing some personal hardships outside of PT this week, although she reports that everything is okay. She also reports that her shoulder has been feeling much better and hasn't experienced any N/T in her arm over the past 2 weeks. She reports adherence to her HEP, although she has not been performing the bird dogs due to discomfort.    Currently in Pain? Yes    Pain Score 1     Pain Location Arm    Pain Orientation Left;Upper    Pain Descriptors / Indicators Sore    Pain Type Chronic pain    Pain Onset More than a month ago    Pain Frequency Intermittent                               [facility] Adult PT Treatment/Exercise - 07/10/21 0001       Shoulder Exercises: Seated   Row Strengthening   low row/ high row super set 2x10 each   Row Weight (lbs) 25      Shoulder Exercises: Prone   Flexion Strengthening   Y's   Flexion Weight (lbs) 2   DB's   Extension Strengthening    2x10   Extension Weight (lbs) 2   DB's   Horizontal ABduction 1 Strengthening   2x10   Horizontal ABduction 1 Weight (lbs) 2   DB's   Other Prone Exercises Quadruped with alternating shoulder elevation 2x10 BIL    Other Prone Exercises Knee push-up position with hands on round side of Bosu, with PT perturbations to ball 3x30sec      Shoulder Exercises: Standing   Other Standing Exercises Biceps curls 2x10 BIL with 4# DB's      Shoulder Exercises: Stretch   Cross Chest Stretch --   2x30sec BIL   Other Shoulder Stretches Child's pose 2x30sec                    PT Short Term Goals - 06/19/21 1526       PT SHORT TERM GOAL #1   Title Pt will report understanding and adherence of her HEP in order to promote independence in the management of her primary impairments.    Baseline HEP given at baseline    Time 3  Period Weeks    Status Achieved    Target Date 07/02/21               PT Long Term Goals - 06/28/21 1004       PT LONG TERM GOAL #1   Title Pt will achieve a FOTO score of 69% in order to demonstrate improved functional ability in regard to her shoulder impairments.    Baseline 53%    Time 6    Period Weeks    Status New      PT LONG TERM GOAL #2   Title Pt will demonstrate L shoulder flexion >165 degrees in order to brush her hair without limitation.    Baseline 166d    Time 6    Period Weeks    Status Achieved      PT LONG TERM GOAL #3   Title Pt will report 0/10 pain when reaching overhead in order to put dishes away without limitation.    Baseline Achieved    Time 6    Period Weeks    Status Achieved      PT LONG TERM GOAL #4   Title Pt will report ability to sit >1 hour with 0-2/10 pain in order to complete her job duties without limitation.    Baseline Unlimited in sitting    Time 6    Period Weeks    Status Achieved      PT LONG TERM GOAL #5   Title Pt will demonstrate L shoulder strength of 5/5 in all planes of manual muscle  testing in order to progress UE exercise regimen without limitation.    Baseline See flowsheet    Time 6    Period Weeks    Status On-going                   Plan - 07/10/21 1436     Clinical Impression Statement Pt responded well to all interventions today, demonstrating good posture and no increase in pain with all selected exercises. In particular, she reports a good chalenge when performing prone scapular strengthening. She leaves with 0/10 pain and reports of minor fatigue. She will continue to benefit from skilled PT to address her primary impairments and return to her prior level of function without limitation.    Personal Factors and Comorbidities Age;Fitness    Examination-Activity Limitations Bathing;Transfers;Reach Overhead;Sit;Hygiene/Grooming;Lift    Examination-Participation Restrictions Laundry;Cleaning;Occupation    Stability/Clinical Decision Making Evolving/Moderate complexity    Clinical Decision Making Moderate    Rehab Potential Good    PT Frequency 2x / week    PT Duration 6 weeks    PT Treatment/Interventions ADLs/Self Care Home Management;Aquatic Therapy;Biofeedback;Cryotherapy;Moist Heat;Therapeutic activities;Therapeutic exercise;Neuromuscular re-education;Manual techniques;Dry needling;Joint Manipulations;Passive range of motion;Taping;Patient/family education    PT Next Visit Plan Progress L shoulder AROM/ strengthening and DNF/ scpaular strengthening, consider d/c if goals met    PT Home Exercise Plan [id number]    Consulted and Agree with Plan of Care Patient             Patient will benefit from skilled therapeutic intervention in order to improve the following deficits and impairments:  Decreased activity tolerance, Decreased strength, Decreased mobility, Impaired sensation, Postural dysfunction, Improper body mechanics, Hypomobility, Pain, Impaired UE functional use, Decreased range of motion  Visit Diagnosis: Acute pain of left  shoulder  Stiffness of left shoulder, not elsewhere classified  Muscle weakness (generalized)  Impaired sensation to light touch     Problem List Patient Active  Problem List   Diagnosis Date Noted   Right knee pain 07/10/2020   Palpitation 07/08/2019   Advanced care planning/counseling discussion 01/22/2016   BMI 35.0-35.9,adult 07/20/2015   Shingles 05/04/2014   Essential hypertension, benign 08/24/2008   Osteopenia 04/14/2008   GERD 06/25/2007    Vanessa [place], PT, DPT 07/10/21 2:41 PM   [place] [facility] [address] [place], [place], [address] Phone: [phone]   Fax:  [phone]  Name: Danielle Kane MRN: [id number] Date of Birth: [birth year]

## 2021-07-11 ENCOUNTER — Other Ambulatory Visit: Payer: Self-pay

## 2021-07-11 ENCOUNTER — Ambulatory Visit (INDEPENDENT_AMBULATORY_CARE_PROVIDER_SITE_OTHER): Payer: Medicare HMO | Admitting: Family Medicine

## 2021-07-11 VITALS — BP 120/70 | HR 90 | Temp 98.4°F | Ht 61.0 in | Wt 189.8 lb

## 2021-07-11 DIAGNOSIS — M8580 Other specified disorders of bone density and structure, unspecified site: Secondary | ICD-10-CM

## 2021-07-11 DIAGNOSIS — Z0000 Encounter for general adult medical examination without abnormal findings: Secondary | ICD-10-CM

## 2021-07-11 DIAGNOSIS — Z23 Encounter for immunization: Secondary | ICD-10-CM | POA: Diagnosis not present

## 2021-07-11 DIAGNOSIS — I10 Essential (primary) hypertension: Secondary | ICD-10-CM | POA: Diagnosis not present

## 2021-07-11 MED ORDER — HYDROCHLOROTHIAZIDE 25 MG PO TABS: 25.0000 mg | ORAL_TABLET | Freq: Every day | ORAL | 3 refills | Status: DC

## 2021-07-11 NOTE — Assessment & Plan Note (Signed)
Stable, chronic.  Continue current medication.  HCTZ 25 mg daily 

## 2021-07-11 NOTE — Progress Notes (Signed)
Patient ID: Danielle Kane, female    DOB: [birth year], 75 y.o.   MRN: [id number]  This visit was conducted in person.  BP 120/70   Pulse 90   Temp 98.4 F (36.9 C) (Temporal)   Ht '5\' 1"'$  (1.549 m)   Wt 189 lb 12 oz (86.1 kg)   SpO2 97%   BMI 35.85 kg/m    CC: Chief Complaint  Patient presents with   Medicare Wellness    Subjective:   HPI: Danielle Kane is a 75 y.o. female presenting on 07/11/2021 for Medicare Wellness  The patient presents for annual medicare wellness, complete physical and review of chronic health problems. He/She also has the following acute concerns today:  Seeing Dr. Lorelei Pont for adhesive capsulitis.Marland Kitchen doing PT. Improving over time.  I have personally reviewed the Medicare Annual Wellness questionnaire and have noted 1. The patient's medical and social history 2. Their use of alcohol, tobacco or illicit drugs 3. Their current medications and supplements 4. The patient's functional ability including ADL's, fall risks, home safety risks and hearing or visual             impairment. 5. Diet and physical activities 6. Evidence for depression or mood disorders 7.         Updated provider list Cognitive evaluation was performed and recorded on pt medicare questionnaire form. The patients weight, height, BMI and visual acuity have been recorded in the chart   I have made referrals, counseling and provided education to the patient based review of the above and I have provided the pt with a written personalized care plan for preventive services.    Documentation of this information was scanned into the electronic record under the media tab.   Hearing Screening   '250Hz'$  '500Hz'$  '1000Hz'$  '2000Hz'$  '4000Hz'$   Right ear '20 20 20 20 20  '$ Left ear 20 0 20 0 0   Vision Screening   Right eye Left eye Both eyes  Without correction     With correction '20/40 20/40 20/30 '$    Flowsheet Row Office Visit from 07/11/2021 in [place] at [facility] Total Score 0         Advance directives and end of life planning reviewed in detail with patient and documented in EMR. Patient given handout on advance care directives if needed. HCPOA and living will updated if needed.  [place] Office Visit from 07/11/2021 in [place] at [facility] Total Score 0      Hypertension:    At goal on HCTZ '25mg'$  daily. BP Readings from Last 3 Encounters:  07/11/21 120/70  06/05/21 130/80  06/03/21 (!) 143/83  Using medication without problems or lightheadedness:  none Chest pain with exertion: none Edema:none Short of breath: none Average home BPs: 120/70s Other issues: Wt Readings from Last 3 Encounters:  07/11/21 189 lb 12 oz (86.1 kg)  06/05/21 193 lb 4 oz (87.7 kg)  07/10/20 193 lb (87.5 kg)     She understands a her increase risk Lab Results  Component Value Date   CHOL 137 07/05/2021   HDL 48.20 07/05/2021   LDLCALC 74 07/05/2021   TRIG 74.0 07/05/2021   CHOLHDL 3 07/05/2021   The 10-year ASCVD risk score (Arnett DK, et al., 2019) is: 18.1%   Values used to calculate the score:     Age: 89 years     Sex: Female     Is Non-Hispanic African American: No  Diabetic: No     Tobacco smoker: No     Systolic Blood Pressure: 123456 mmHg     Is BP treated: Yes     HDL Cholesterol: 48.2 mg/dL     Total Cholesterol: 137 mg/dL     Diet: moderate, eating less at night  Exercise: walking daily  Relevant past medical, surgical, family and social history reviewed and updated as indicated. Interim medical history since our last visit reviewed. Allergies and medications reviewed and updated. Outpatient Medications Prior to Visit  Medication Sig Dispense Refill   Calcium Carbonate-Vitamin D 600-400 MG-UNIT tablet Take 1 tablet by mouth daily.       esomeprazole (NEXIUM) 20 MG capsule Take 20 mg by mouth daily at 12 noon.     hydrochlorothiazide (HYDRODIURIL) 25 MG tablet TAKE 1 TABLET BY MOUTH EVERY DAY 90 tablet 3   ibuprofen  (ADVIL,MOTRIN) 200 MG tablet Take 400 mg by mouth every 6 (six) hours as needed for moderate pain.     Multiple Vitamin (MULTIVITAMIN) tablet Take 1 tablet by mouth daily.       Simethicone 180 MG CAPS Take 180 mg by mouth daily as needed (for bloating).     cyclobenzaprine (FLEXERIL) 10 MG tablet Take 0.5 tablets (5 mg total) by mouth 2 (two) times daily as needed for muscle spasms. (Patient not taking: Reported on 06/11/2021) 10 tablet 0   traMADol (ULTRAM) 50 MG tablet Take 1 tablet (50 mg total) by mouth every 8 (eight) hours as needed for moderate pain. 20 tablet 0   No facility-administered medications prior to visit.     Per HPI unless specifically indicated in ROS section below Review of Systems  Constitutional:  Negative for fatigue and fever.  HENT:  Negative for congestion.   Eyes:  Negative for pain.  Respiratory:  Negative for cough and shortness of breath.   Cardiovascular:  Negative for chest pain, palpitations and leg swelling.  Gastrointestinal:  Negative for abdominal pain.  Genitourinary:  Negative for dysuria and vaginal bleeding.  Musculoskeletal:  Negative for back pain.  Neurological:  Negative for syncope, light-headedness and headaches.  Psychiatric/Behavioral:  Negative for dysphoric mood.   Objective:  BP 120/70   Pulse 90   Temp 98.4 F (36.9 C) (Temporal)   Ht '5\' 1"'$  (1.549 m)   Wt 189 lb 12 oz (86.1 kg)   SpO2 97%   BMI 35.85 kg/m   Wt Readings from Last 3 Encounters:  07/11/21 189 lb 12 oz (86.1 kg)  06/05/21 193 lb 4 oz (87.7 kg)  07/10/20 193 lb (87.5 kg)      Physical Exam Vitals and nursing note reviewed.  Constitutional:      General: She is not in acute distress.    Appearance: Normal appearance. She is well-developed. She is not ill-appearing or toxic-appearing.  HENT:     Head: Normocephalic.     Right Ear: Hearing, tympanic membrane, ear canal and external ear normal.     Left Ear: Hearing, tympanic membrane, ear canal and external ear  normal.     Nose: Nose normal.  Eyes:     General: Lids are normal. Lids are everted, no foreign bodies appreciated.     Conjunctiva/sclera: Conjunctivae normal.     Pupils: Pupils are equal, round, and reactive to light.  Neck:     Thyroid: No thyroid mass or thyromegaly.     Vascular: No carotid bruit.     Trachea: Trachea normal.  Cardiovascular:  Rate and Rhythm: Normal rate and regular rhythm.     Heart sounds: Normal heart sounds, S1 normal and S2 normal. No murmur heard.   No gallop.  Pulmonary:     Effort: Pulmonary effort is normal. No respiratory distress.     Breath sounds: Normal breath sounds. No wheezing, rhonchi or rales.  Abdominal:     General: Bowel sounds are normal. There is no distension or abdominal bruit.     Palpations: Abdomen is soft. There is no fluid wave or mass.     Tenderness: There is no abdominal tenderness. There is no guarding or rebound.     Hernia: No hernia is present.  Musculoskeletal:     Cervical back: Normal range of motion and neck supple.  Lymphadenopathy:     Cervical: No cervical adenopathy.  Skin:    General: Skin is warm and dry.     Findings: No rash.  Neurological:     Mental Status: She is alert.     Cranial Nerves: No cranial nerve deficit.     Sensory: No sensory deficit.  Psychiatric:        Mood and Affect: Mood is not anxious or depressed.        Speech: Speech normal.        Behavior: Behavior normal. Behavior is cooperative.        Judgment: Judgment normal.      Results for orders placed or performed in visit on 07/05/21  Comprehensive metabolic panel  Result Value Ref Range   Sodium 137 135 - 145 mEq/L   Potassium 4.3 3.5 - 5.1 mEq/L   Chloride 101 96 - 112 mEq/L   CO2 30 19 - 32 mEq/L   Glucose, Bld 90 70 - 99 mg/dL   BUN 12 6 - 23 mg/dL   Creatinine, Ser 0.89 0.40 - 1.20 mg/dL   Total Bilirubin 0.9 0.2 - 1.2 mg/dL   Alkaline Phosphatase 48 39 - 117 U/L   AST 15 0 - 37 U/L   ALT 14 0 - 35 U/L   Total  Protein 7.0 6.0 - 8.3 g/dL   Albumin 3.7 3.5 - 5.2 g/dL   GFR 63.40 >60.00 mL/min   Calcium 9.4 8.4 - 10.5 mg/dL  Lipid panel  Result Value Ref Range   Cholesterol 137 0 - 200 mg/dL   Triglycerides 74.0 0.0 - 149.0 mg/dL   HDL 48.20 >39.00 mg/dL   VLDL 14.8 0.0 - 40.0 mg/dL   LDL Cholesterol 74 0 - 99 mg/dL   Total CHOL/HDL Ratio 3    NonHDL 88.88     This visit occurred during the SARS-CoV-2 public health emergency.  Safety protocols were in place, including screening questions prior to the visit, additional usage of staff PPE, and extensive cleaning of exam room while observing appropriate contact time as indicated for disinfecting solutions.   COVID 19 screen:  No recent travel or known exposure to COVID19 The patient denies respiratory symptoms of COVID 19 at this time. The importance of social distancing was discussed today.   Assessment and Plan   The patient's preventative maintenance and recommended screening tests for an annual wellness exam were reviewed in full today. Brought up to date unless services declined.  Counselled on the importance of diet, exercise, and its role in overall health and mortality. The patient's FH and SH was reviewed, including their home life, tobacco status, and drug and alcohol status.   Last DEXA 2017, due for repeat in 5  years, osteopenia.    DUE Colonoscopy 2007, rpt in 10 years, 2017.   No family history.. Plan Q3 year cologuard instead... 07/2019 negatve , no further indicated. Vaccines: Uptodate with Tdap,  COVID19 seriesx 3 , PNA , prevnar,  given flu shot today, had shingrix x 1 but lot of )SE. DVE/pap: Not indicated.. S/p  TAH hysterectomy.      Hep C: Done Mammogram overdue. Will schedule  Problem List Items Addressed This Visit     Essential hypertension, benign    Stable, chronic.  Continue current medication.   HCTZ 25 mg daily      Relevant Medications   hydrochlorothiazide (HYDRODIURIL) 25 MG tablet   Osteopenia     Due for re-peat DEX      Relevant Orders   DG Bone Density   Other Visit Diagnoses     Medicare annual wellness visit, subsequent    -  Primary   Need for influenza vaccination       Relevant Orders   Flu Vaccine QUAD High Dose(Fluad) (Completed)       Eliezer Lofts, MD

## 2021-07-11 NOTE — Assessment & Plan Note (Signed)
Due for re-peat DEX

## 2021-07-11 NOTE — Patient Instructions (Addendum)
Continue great work on heart healthy diet and regular exercise. Continue weight loss. Please call the location of your choice from the menu below to schedule your Mammogram and/or Bone Density appointment.    [facility]                      Phone:  [phone] [address], [place] [address]                                                             Services: Traditional and 3D Mammogram, [place] [facility]                 Phone: [phone] [address], [place] [address]    Service: Bone Density ONLY   *this site does NOT perform mammograms  [facility]                        Phone:  [phone] [address], [place] [address]                                            Services:  3D Mammogram and [place] at [facility]   Phone:  [phone]   [place], [place] [address]                                            Services: 3D Mammogram and [place]  [place] at [facility])  Phone:  [phone]   [address] [place], [place] [address]  Services:  3D Mammogram and Bone Density

## 2021-07-12 ENCOUNTER — Encounter: Payer: Medicare HMO | Admitting: Family Medicine

## 2021-07-15 ENCOUNTER — Ambulatory Visit: Payer: Medicare HMO

## 2021-07-15 ENCOUNTER — Other Ambulatory Visit: Payer: Self-pay

## 2021-07-15 DIAGNOSIS — R208 Other disturbances of skin sensation: Secondary | ICD-10-CM

## 2021-07-15 DIAGNOSIS — M25612 Stiffness of left shoulder, not elsewhere classified: Secondary | ICD-10-CM | POA: Diagnosis not present

## 2021-07-15 DIAGNOSIS — M25512 Pain in left shoulder: Secondary | ICD-10-CM | POA: Diagnosis not present

## 2021-07-15 DIAGNOSIS — M6281 Muscle weakness (generalized): Secondary | ICD-10-CM

## 2021-07-15 NOTE — Therapy (Signed)
[facility] [place], [place], [address] Phone: [phone]   Fax:  [phone]  Physical Therapy Treatment/ Discharge Summary  Patient Details  Name: Danielle Kane MRN: [id number] Date of Birth: 1946/04/24 Referring Provider (PT): Owens Loffler   Encounter Date: 07/15/2021   PT End of Session - 07/15/21 1456     Visit Number 6    Number of Visits 13    Date for PT Re-Evaluation 07/23/21    Authorization Type [employer] MCR    Progress Note Due on Visit 10    PT Start Time 2122    PT Stop Time 1500    PT Time Calculation (min) 15 min    Activity Tolerance Patient tolerated treatment well    Behavior During Therapy [facility] for tasks assessed/performed             Past Medical History:  Diagnosis Date   GERD (gastroesophageal reflux disease)     Past Surgical History:  Procedure Laterality Date   ABDOMINAL HYSTERECTOMY      There were no vitals filed for this visit.   Subjective Assessment - 07/15/21 1441     Subjective Pt reports no pain today and adds that she has continued to be adherent with her HEP. She reports that she has lost 4 pounds since starting PT due to increased activity and expresses satisfaction with this result, along with her progress made in her shoulder symptoms. She reports readiness to be discharged due to feeling functionally independent at this time.    How long can you sit comfortably? Unlimited    How long can you stand comfortably? Unlimited    How long can you walk comfortably? Unlimited    Currently in Pain? No/denies    Pain Score 0-No pain                [facility] PT Assessment - 07/15/21 0001       Observation/Other Assessments   Focus on Therapeutic Outcomes (FOTO)  72%      Functional Tests   Functional tests Other      Other:   Other/ Comments Lift from waist to overhead: Pt able to achieve with up to 20#      Strength   Left Shoulder Flexion 5/5    Left  Shoulder Extension 5/5    Left Shoulder ABduction 5/5    Left Shoulder Internal Rotation 5/5    Left Shoulder External Rotation 5/5    Left Elbow Extension 5/5                                    PT Education - 07/15/21 1457     Education Details Pt educated on significance of progress made through objective and subjective measures throughout her rehab journey.    Person(s) Educated Patient    Methods Explanation    Comprehension Verbalized understanding              PT Short Term Goals - 06/19/21 1526       PT SHORT TERM GOAL #1   Title Pt will report understanding and adherence of her HEP in order to promote independence in the management of her primary impairments.    Baseline HEP given at baseline    Time 3    Period Weeks    Status Achieved    Target Date 07/02/21  PT Long Term Goals - 07/15/21 1459       PT LONG TERM GOAL #1   Title Pt will achieve a FOTO score of 69% in order to demonstrate improved functional ability in regard to her shoulder impairments.    Baseline 72% on 07/15/2021    Time 6    Period Weeks    Status Achieved      PT LONG TERM GOAL #2   Title Pt will demonstrate L shoulder flexion >165 degrees in order to brush her hair without limitation.    Baseline 166d    Time 6    Period Weeks    Status Achieved      PT LONG TERM GOAL #3   Title Pt will report 0/10 pain when reaching overhead in order to put dishes away without limitation.    Baseline Achieved    Time 6    Period Weeks    Status Achieved      PT LONG TERM GOAL #4   Title Pt will report ability to sit >1 hour with 0-2/10 pain in order to complete her job duties without limitation.    Baseline Unlimited in sitting    Time 6    Period Weeks    Status Achieved      PT LONG TERM GOAL #5   Title Pt will demonstrate L shoulder strength of 5/5 in all planes of manual muscle testing in order to progress UE exercise regimen without  limitation.    Baseline 5/5 in all planes    Time 6    Period Weeks    Status Achieved                   Plan - 07/15/21 1457     Clinical Impression Statement Upon re-assessment of objective and subjective measures, the pt has met all of her goals of PT, including L shoulder strength, AROM, pain, and functional ability. She has no remaining impairments at this time. Due to this, the pt agrees to be discharged from PT at this time.    PT Next Visit Plan Pt is discharged from PT at this time    PT Home Exercise Plan [id number]    Consulted and Agree with Plan of Care Patient             Patient will benefit from skilled therapeutic intervention in order to improve the following deficits and impairments:     Visit Diagnosis: Acute pain of left shoulder  Stiffness of left shoulder, not elsewhere classified  Muscle weakness (generalized)  Impaired sensation to light touch     Problem List Patient Active Problem List   Diagnosis Date Noted   Right knee pain 07/10/2020   Palpitation 07/08/2019   Advanced care planning/counseling discussion 01/22/2016   BMI 35.0-35.9,adult 07/20/2015   Shingles 05/04/2014   Essential hypertension, benign 08/24/2008   Osteopenia 04/14/2008   GERD 06/25/2007     [facility] [place], [place], [address] Phone: [phone]   Fax:  [phone]  Name: Danielle Kane MRN: [id number] Date of Birth: 1946-01-29  PHYSICAL THERAPY DISCHARGE SUMMARY  Visits from Start of Care: 6  Current functional level related to goals / functional outcomes: Pt has met all of her functional goals of PT.   Remaining deficits: None   Education / Equipment: HEP   Patient agrees to discharge. Patient goals were met. Patient is being discharged due to meeting the stated rehab goals.  Vanessa [place], PT, DPT 07/15/21 3:01 PM

## 2021-08-06 ENCOUNTER — Ambulatory Visit: Payer: Medicare HMO | Admitting: Family Medicine

## 2021-08-08 ENCOUNTER — Other Ambulatory Visit: Payer: Self-pay | Admitting: Family Medicine

## 2021-08-08 DIAGNOSIS — M8580 Other specified disorders of bone density and structure, unspecified site: Secondary | ICD-10-CM

## 2021-11-15 DIAGNOSIS — D235 Other benign neoplasm of skin of trunk: Secondary | ICD-10-CM | POA: Diagnosis not present

## 2021-11-15 DIAGNOSIS — D2371 Other benign neoplasm of skin of right lower limb, including hip: Secondary | ICD-10-CM | POA: Diagnosis not present

## 2021-11-15 DIAGNOSIS — L814 Other melanin hyperpigmentation: Secondary | ICD-10-CM | POA: Diagnosis not present

## 2021-11-15 DIAGNOSIS — B354 Tinea corporis: Secondary | ICD-10-CM | POA: Diagnosis not present

## 2021-11-15 DIAGNOSIS — L570 Actinic keratosis: Secondary | ICD-10-CM | POA: Diagnosis not present

## 2021-11-15 DIAGNOSIS — D225 Melanocytic nevi of trunk: Secondary | ICD-10-CM | POA: Diagnosis not present

## 2022-04-18 IMAGING — DX DG CHEST 2V
2 series · 2 of 2 positions shown · non-contrast
Comparison: 07/13/2015

CLINICAL DATA: Chest pain.

EXAM:
CHEST - 2 VIEW

[w chest pa]
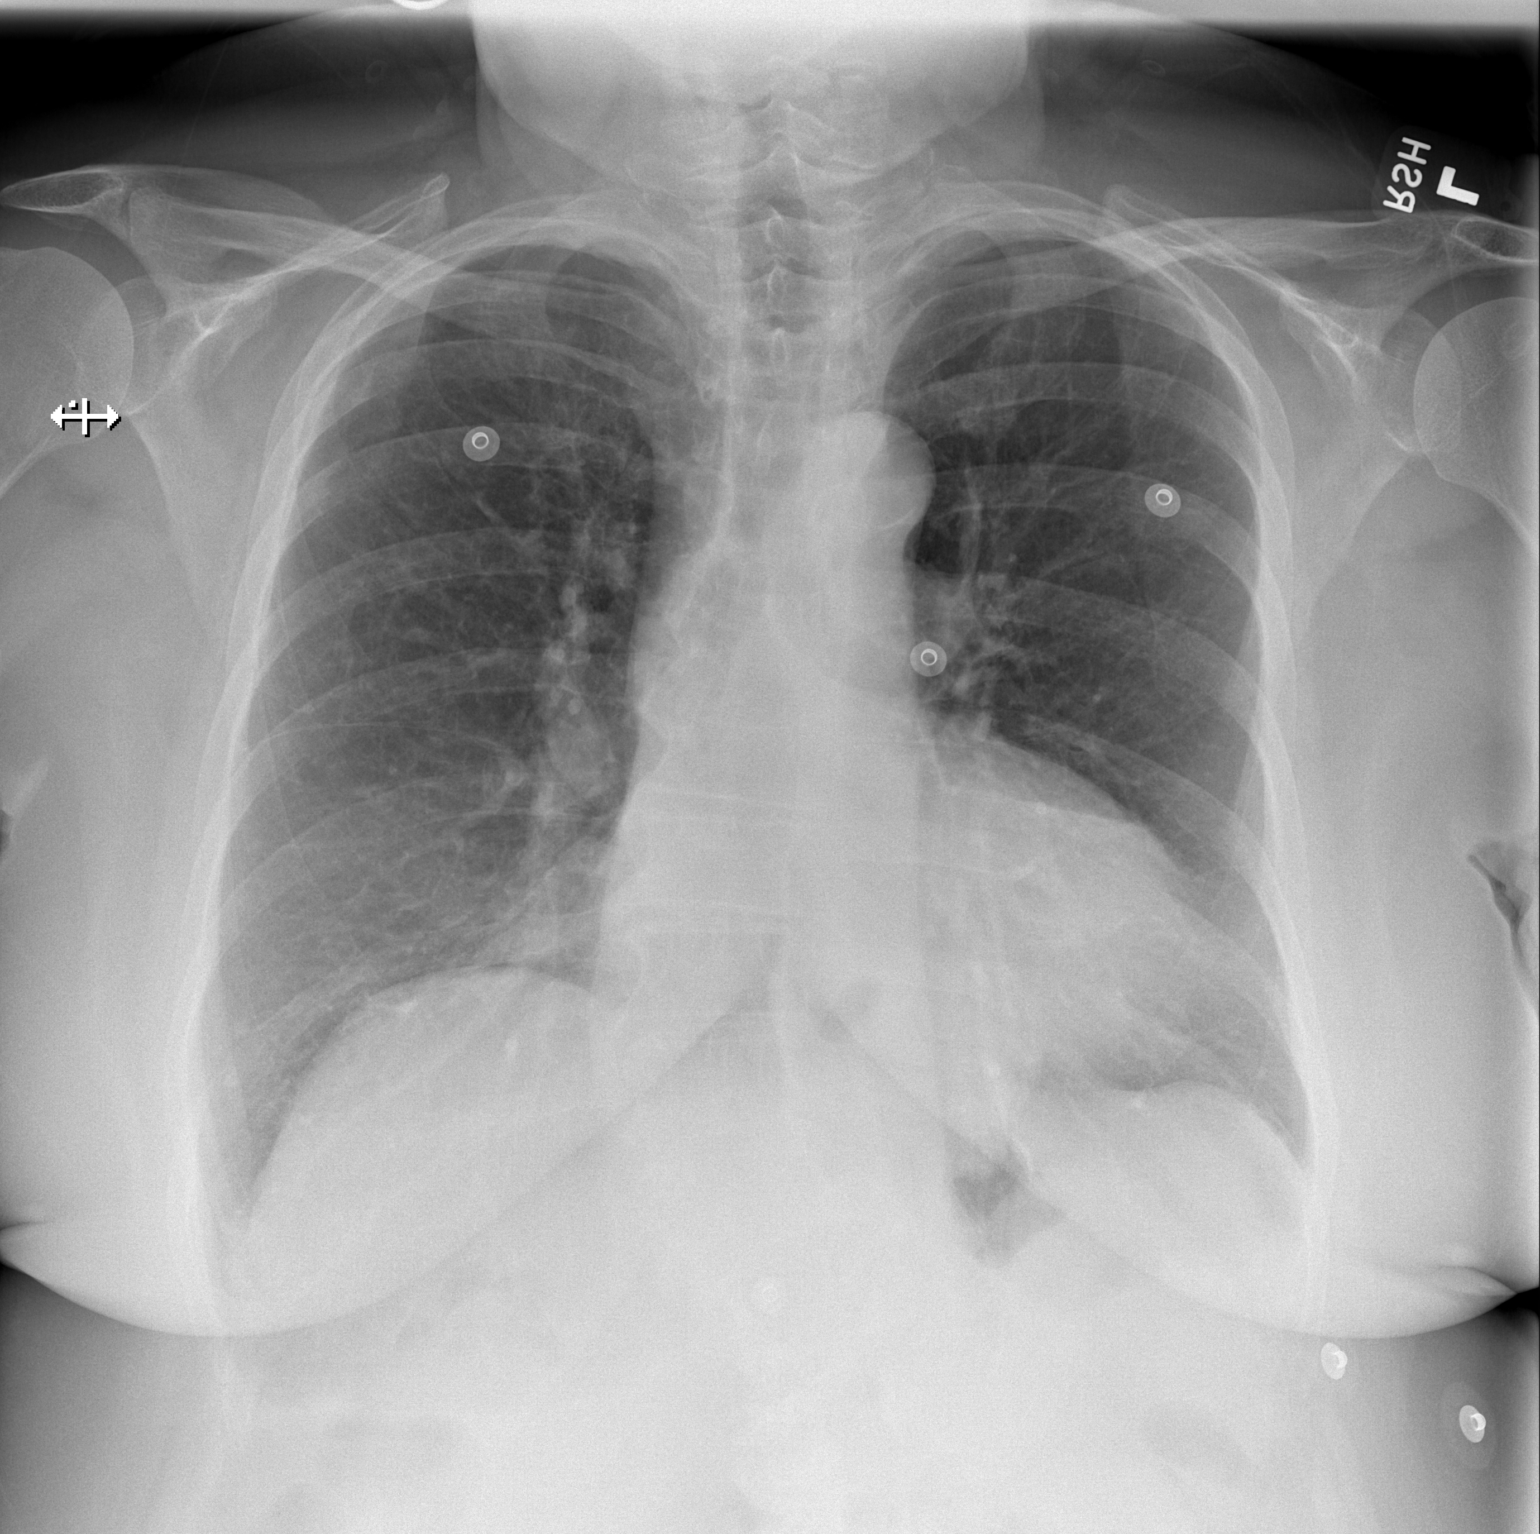

[w chest lat]
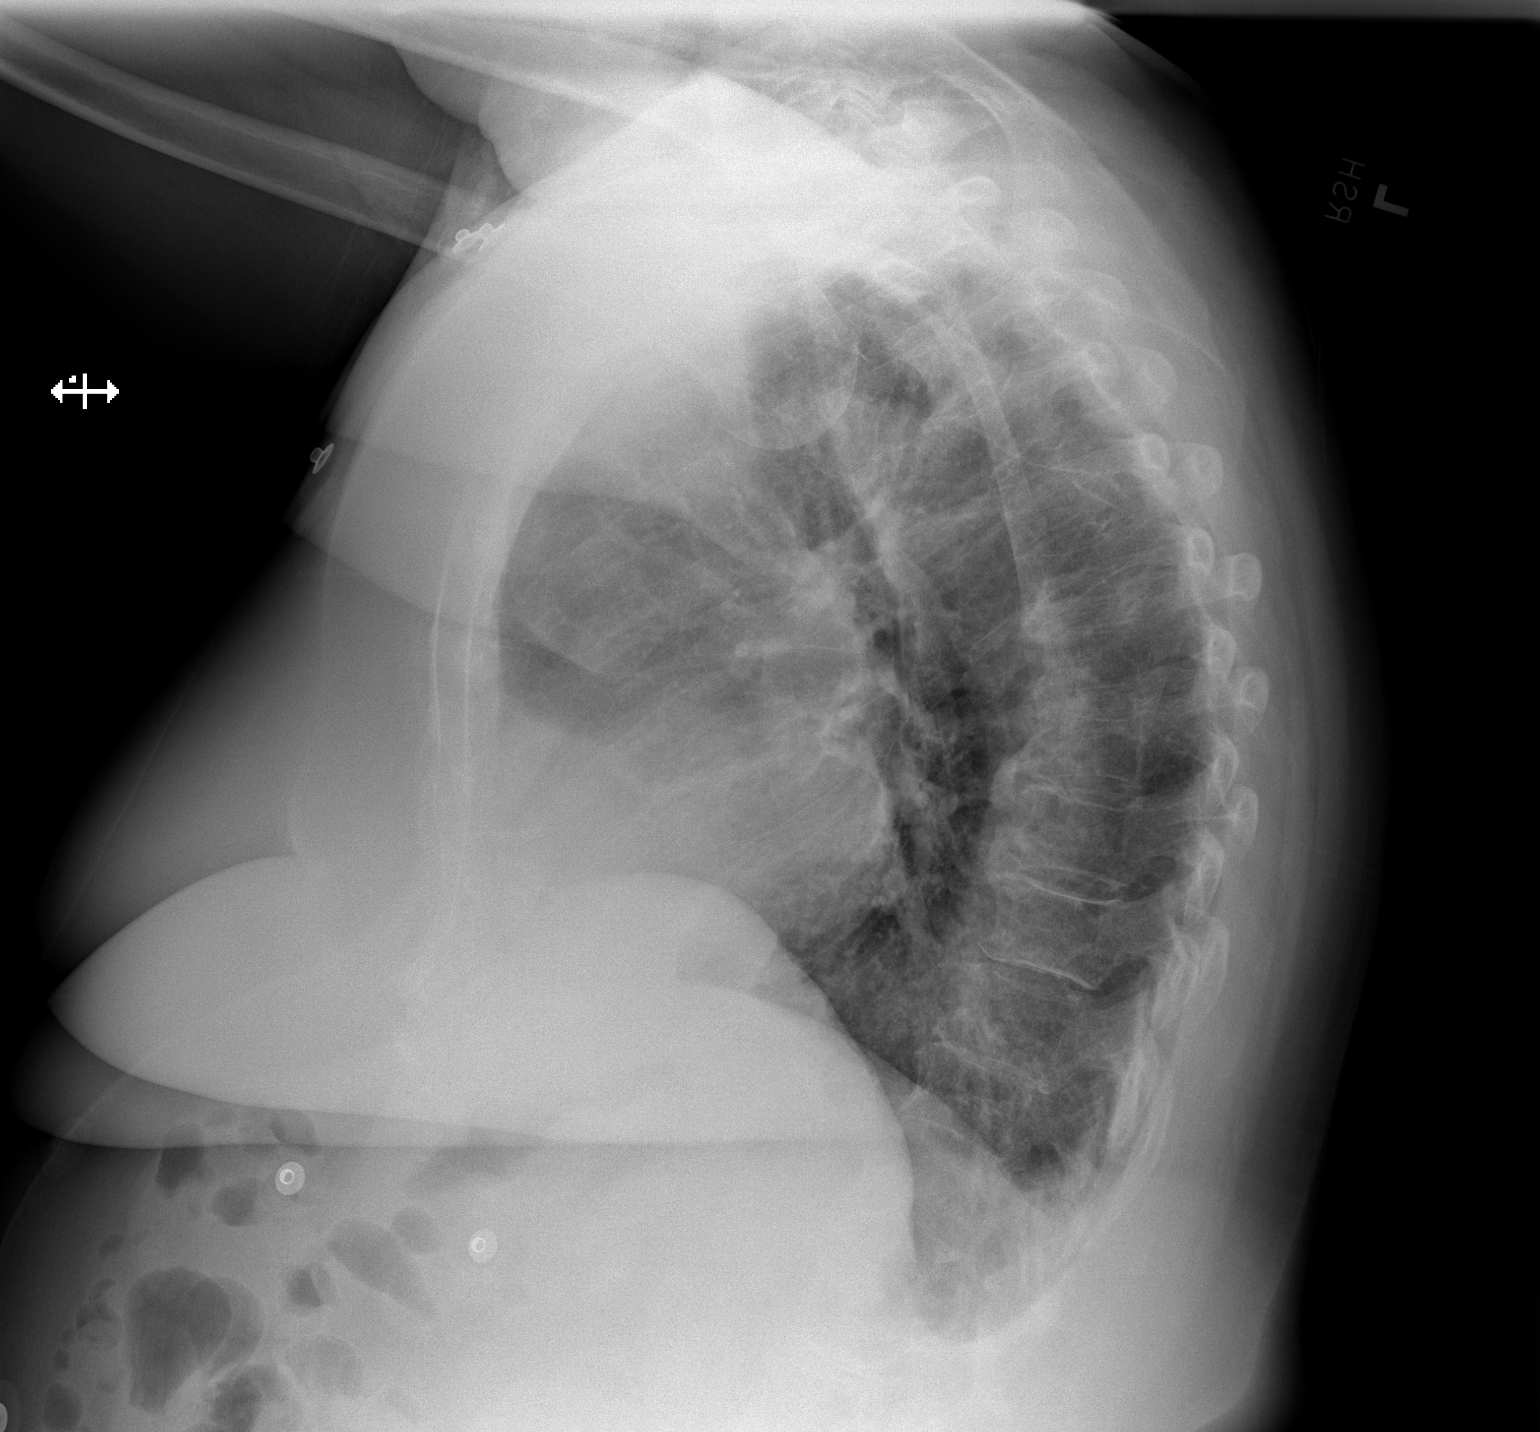

[2 of 2 positions shown; findings below may reference images not displayed]

FINDINGS: Prominent left ventricular size, stable. Normal aortic and hilar
contours. Blunting of the posterior costophrenic sulci which is also
seen on prior. No edema or consolidation. No pneumothorax. Thoracic
dextroscoliosis.
IMPRESSION: Stable compared to 3979.  No acute finding.

## 2022-06-28 ENCOUNTER — Telehealth: Payer: Self-pay | Admitting: Family Medicine

## 2022-06-28 DIAGNOSIS — Z13220 Encounter for screening for lipoid disorders: Secondary | ICD-10-CM

## 2022-06-28 DIAGNOSIS — M8580 Other specified disorders of bone density and structure, unspecified site: Secondary | ICD-10-CM

## 2022-06-28 NOTE — Telephone Encounter (Signed)
-----   Message from Ellamae Sia sent at 06/24/2022  3:06 PM EDT ----- Regarding: Lab orders for Friday, 9.1.23 Patient is scheduled for CPX labs, please order future labs, Thanks , Karna Christmas

## 2022-07-04 ENCOUNTER — Other Ambulatory Visit (INDEPENDENT_AMBULATORY_CARE_PROVIDER_SITE_OTHER): Payer: Medicare HMO

## 2022-07-04 DIAGNOSIS — Z13220 Encounter for screening for lipoid disorders: Secondary | ICD-10-CM

## 2022-07-04 DIAGNOSIS — M8580 Other specified disorders of bone density and structure, unspecified site: Secondary | ICD-10-CM

## 2022-07-04 LAB — VITAMIN D 25 HYDROXY (VIT D DEFICIENCY, FRACTURES): VITD: 47.38 ng/mL (ref 30.00–100.00)

## 2022-07-04 LAB — COMPREHENSIVE METABOLIC PANEL
ALT: 12 U/L (ref 0–35)
AST: 15 U/L (ref 0–37)
Albumin: 3.9 g/dL (ref 3.5–5.2)
Alkaline Phosphatase: 53 U/L (ref 39–117)
BUN: 14 mg/dL (ref 6–23)
CO2: 28 mEq/L (ref 19–32)
Calcium: 9.4 mg/dL (ref 8.4–10.5)
Chloride: 101 mEq/L (ref 96–112)
Creatinine, Ser: 0.89 mg/dL (ref 0.40–1.20)
GFR: 62.95 mL/min (ref >60.00)
Glucose, Bld: 93 mg/dL (ref 70–99)
Potassium: 4.2 mEq/L (ref 3.5–5.1)
Sodium: 138 mEq/L (ref 135–145)
Total Bilirubin: 1 mg/dL (ref 0.2–1.2)
Total Protein: 7.4 g/dL (ref 6.0–8.3)

## 2022-07-04 LAB — LIPID PANEL
Cholesterol: 145 mg/dL (ref 0–200)
HDL: 43.4 mg/dL (ref >39.00)
LDL Cholesterol: 81 mg/dL (ref 0–99)
NonHDL: 101.2
Total CHOL/HDL Ratio: 3
Triglycerides: 100 mg/dL (ref 0.0–149.0)
VLDL: 20 mg/dL (ref 0.0–40.0)

## 2022-07-04 NOTE — Progress Notes (Signed)
No critical labs need to be addressed urgently. We will discuss labs in detail at upcoming office visit.   

## 2022-07-11 ENCOUNTER — Encounter: Payer: Self-pay | Admitting: Family Medicine

## 2022-07-11 ENCOUNTER — Ambulatory Visit (INDEPENDENT_AMBULATORY_CARE_PROVIDER_SITE_OTHER): Payer: Medicare HMO | Admitting: Family Medicine

## 2022-07-11 VITALS — BP 130/80 | HR 88 | Temp 98.0°F | Ht 61.0 in | Wt 190.5 lb

## 2022-07-11 DIAGNOSIS — I10 Essential (primary) hypertension: Secondary | ICD-10-CM | POA: Diagnosis not present

## 2022-07-11 DIAGNOSIS — E7800 Pure hypercholesterolemia, unspecified: Secondary | ICD-10-CM

## 2022-07-11 DIAGNOSIS — Z0000 Encounter for general adult medical examination without abnormal findings: Secondary | ICD-10-CM

## 2022-07-11 DIAGNOSIS — Z23 Encounter for immunization: Secondary | ICD-10-CM | POA: Diagnosis not present

## 2022-07-11 DIAGNOSIS — M8580 Other specified disorders of bone density and structure, unspecified site: Secondary | ICD-10-CM

## 2022-07-11 NOTE — Progress Notes (Unsigned)
Patient ID: Danielle Kane, female    DOB: Mar 08, 1946, 76 y.o.   MRN: [id number]  This visit was conducted in person.  BP 130/80   Pulse 88   Temp 98 F (36.7 C) (Oral)   Ht '5\' 1"'$  (1.549 m)   Wt 190 lb 8 oz (86.4 kg)   SpO2 95%   BMI 35.99 kg/m    CC:  Chief Complaint  Patient presents with   Medicare Wellness    Subjective:   HPI: Danielle Kane is a 76 y.o. female presenting on 07/11/2022 for Medicare Wellness  The patient presents for annual medicare wellness, complete physical and review of chronic health problems. He/She also has the following acute concerns today:  I have personally reviewed the Medicare Annual Wellness questionnaire and have noted 1. The patient's medical and social history 2. Their use of alcohol, tobacco or illicit drugs 3. Their current medications and supplements 4. The patient's functional ability including ADL's, fall risks, home safety risks and hearing or visual             impairment. 5. Diet and physical activities 6. Evidence for depression or mood disorders 7.         Updated provider list. Cognitive evaluation was performed and recorded on pt medicare questionnaire form. The patients weight, height, BMI and visual acuity have been recorded in the chart   I have made referrals, counseling and provided education to the patient based review of the above and I have provided the pt with a written personalized care plan for preventive services.    Documentation of this information was scanned into the electronic record under the media tab.  Hearing Screening  Method: Audiometry   '500Hz'$  '1000Hz'$  '2000Hz'$  '4000Hz'$   Right ear '20 20 20 20  '$ Left ear 0 0 20 0  Vision Screening - Comments:: Eye Exam schedule for September 03, 2022 with Dr. Einar Gip at [facility]  No falls in last 12 months.  [place] Visit from 07/11/2022 in [place] at [facility]  PHQ-2 Total Score 0      Hypertension:   At goal on hctz 25 mg daily  BP  Readings from Last 3 Encounters:  07/11/22 130/80  07/11/21 120/70  06/05/21 130/80  Using medication without problems or lightheadedness:  none Chest pain with exertion: none Edema: none Short of breath: none Average home BPs: Other issues:   Diet:  moderate Exercise:   She is not interested in starting a statin.  The 10-year ASCVD risk score (Arnett DK, et al., 2019) is: 23.3%   Values used to calculate the score:     Age: 73 years     Sex: Female     Is Non-Hispanic African American: No     Diabetic: No     Tobacco smoker: No     Systolic Blood Pressure: 235 mmHg     Is BP treated: Yes     HDL Cholesterol: 43.4 mg/dL     Total Cholesterol: 145 mg/dL       Relevant past medical, surgical, family and social history reviewed and updated as indicated. Interim medical history since our last visit reviewed. Allergies and medications reviewed and updated. Outpatient Medications Prior to Visit  Medication Sig Dispense Refill   acetaminophen (TYLENOL) 500 MG tablet Take 500 mg by mouth every 6 (six) hours as needed.     Calcium Carbonate-Vitamin D 600-400 MG-UNIT tablet Take 1 tablet by mouth daily.  esomeprazole (NEXIUM) 20 MG capsule Take 20 mg by mouth daily at 12 noon.     hydrochlorothiazide (HYDRODIURIL) 25 MG tablet Take 1 tablet (25 mg total) by mouth daily. 90 tablet 3   ibuprofen (ADVIL,MOTRIN) 200 MG tablet Take 400 mg by mouth every 6 (six) hours as needed for moderate pain.     Multiple Vitamin (MULTIVITAMIN) tablet Take 1 tablet by mouth daily.       Simethicone 180 MG CAPS Take 180 mg by mouth daily as needed (for bloating).     No facility-administered medications prior to visit.     Per HPI unless specifically indicated in ROS section below Review of Systems Objective:  BP 130/80   Pulse 88   Temp 98 F (36.7 C) (Oral)   Ht '5\' 1"'$  (1.549 m)   Wt 190 lb 8 oz (86.4 kg)   SpO2 95%   BMI 35.99 kg/m   Wt Readings from Last 3 Encounters:  07/11/22  190 lb 8 oz (86.4 kg)  07/11/21 189 lb 12 oz (86.1 kg)  06/05/21 193 lb 4 oz (87.7 kg)      Physical Exam    Results for orders placed or performed in visit on 07/04/22  VITAMIN D 25 Hydroxy (Vit-D Deficiency, Fractures)  Result Value Ref Range   VITD 47.38 30.00 - 100.00 ng/mL  Comprehensive metabolic panel  Result Value Ref Range   Sodium 138 135 - 145 mEq/L   Potassium 4.2 3.5 - 5.1 mEq/L   Chloride 101 96 - 112 mEq/L   CO2 28 19 - 32 mEq/L   Glucose, Bld 93 70 - 99 mg/dL   BUN 14 6 - 23 mg/dL   Creatinine, Ser 0.89 0.40 - 1.20 mg/dL   Total Bilirubin 1.0 0.2 - 1.2 mg/dL   Alkaline Phosphatase 53 39 - 117 U/L   AST 15 0 - 37 U/L   ALT 12 0 - 35 U/L   Total Protein 7.4 6.0 - 8.3 g/dL   Albumin 3.9 3.5 - 5.2 g/dL   GFR 62.95 >60.00 mL/min   Calcium 9.4 8.4 - 10.5 mg/dL  Lipid panel  Result Value Ref Range   Cholesterol 145 0 - 200 mg/dL   Triglycerides 100.0 0.0 - 149.0 mg/dL   HDL 43.40 >39.00 mg/dL   VLDL 20.0 0.0 - 40.0 mg/dL   LDL Cholesterol 81 0 - 99 mg/dL   Total CHOL/HDL Ratio 3    NonHDL 101.20      COVID 19 screen:  No recent travel or known exposure to COVID19 The patient denies respiratory symptoms of COVID 19 at this time. The importance of social distancing was discussed today.   Assessment and Plan The patient's preventative maintenance and recommended screening tests for an annual wellness exam were reviewed in full today. Brought up to date unless services declined.  Counselled on the importance of diet, exercise, and its role in overall health and mortality. The patient's FH and SH was reviewed, including their home life, tobacco status, and drug and alcohol status.   Last DEXA 2017, due for repeat in 5 years, osteopenia.    DUE Colonoscopy 2007, rpt in 10 years, 2017.   No family history.. Plan Q3 year cologuard instead... 07/2019 negatve , no further indicated. Vaccines: Uptodate with Tdap,  COVID19 seriesx 3 , PNA , prevnar,  given flu shot  today, had shingrix x 1 but lot of SE. DVE/pap: Not indicated.. S/P TAH hysterectomy.      Hep C: Done  Betsey Sossamon, MD   

## 2022-07-11 NOTE — Patient Instructions (Signed)
Please call the location of your choice from the menu below to schedule your Mammogram and/or Bone Density appointment.    [place]   [facility]                      Phone:  [phone] [address]                               [place], [place] [address]                                                             Services: Traditional and 3D Mammogram, Bone Density   [facility]                 Phone: [phone] [address]                                                       [place], [place] [address]    Service: Bone Density ONLY   *this site does NOT perform mammograms  [facility]                        Phone:  [phone] [address]                                  [place], [place] [address]                                            Services:  3D Mammogram and Bone Density    

## 2022-07-14 DIAGNOSIS — E7800 Pure hypercholesterolemia, unspecified: Secondary | ICD-10-CM | POA: Insufficient documentation

## 2022-07-14 NOTE — Assessment & Plan Note (Signed)
Stable, chronic.  Continue current medication.  HCTZ 25 mg p.o. daily 

## 2022-07-14 NOTE — Assessment & Plan Note (Signed)
Increased 10-year risk of cardiovascular disease.  Statin indicated.  She is not interested in starting a statin medication at this time despite discussion.  She will work on low-cholesterol diet, regular exercise and weight loss.  We will reevaluate cholesterol in 3 to 6 months.

## 2022-08-11 ENCOUNTER — Other Ambulatory Visit: Payer: Self-pay | Admitting: Family Medicine

## 2022-09-24 DIAGNOSIS — H43813 Vitreous degeneration, bilateral: Secondary | ICD-10-CM | POA: Diagnosis not present

## 2022-09-24 DIAGNOSIS — H02403 Unspecified ptosis of bilateral eyelids: Secondary | ICD-10-CM | POA: Diagnosis not present

## 2022-09-24 DIAGNOSIS — H2513 Age-related nuclear cataract, bilateral: Secondary | ICD-10-CM | POA: Diagnosis not present

## 2022-09-24 DIAGNOSIS — H25013 Cortical age-related cataract, bilateral: Secondary | ICD-10-CM | POA: Diagnosis not present

## 2022-09-24 DIAGNOSIS — H5213 Myopia, bilateral: Secondary | ICD-10-CM | POA: Diagnosis not present

## 2022-09-24 DIAGNOSIS — H524 Presbyopia: Secondary | ICD-10-CM | POA: Diagnosis not present

## 2022-09-24 DIAGNOSIS — H52203 Unspecified astigmatism, bilateral: Secondary | ICD-10-CM | POA: Diagnosis not present

## 2022-10-22 DIAGNOSIS — E860 Dehydration: Secondary | ICD-10-CM | POA: Diagnosis not present

## 2022-11-17 DIAGNOSIS — L814 Other melanin hyperpigmentation: Secondary | ICD-10-CM | POA: Diagnosis not present

## 2022-11-17 DIAGNOSIS — D2272 Melanocytic nevi of left lower limb, including hip: Secondary | ICD-10-CM | POA: Diagnosis not present

## 2022-11-17 DIAGNOSIS — L821 Other seborrheic keratosis: Secondary | ICD-10-CM | POA: Diagnosis not present

## 2022-11-17 DIAGNOSIS — D1801 Hemangioma of skin and subcutaneous tissue: Secondary | ICD-10-CM | POA: Diagnosis not present

## 2022-11-17 DIAGNOSIS — L570 Actinic keratosis: Secondary | ICD-10-CM | POA: Diagnosis not present

## 2022-11-17 DIAGNOSIS — D2262 Melanocytic nevi of left upper limb, including shoulder: Secondary | ICD-10-CM | POA: Diagnosis not present

## 2022-11-17 DIAGNOSIS — L820 Inflamed seborrheic keratosis: Secondary | ICD-10-CM | POA: Diagnosis not present

## 2022-11-17 DIAGNOSIS — D225 Melanocytic nevi of trunk: Secondary | ICD-10-CM | POA: Diagnosis not present

## 2023-06-18 ENCOUNTER — Encounter (INDEPENDENT_AMBULATORY_CARE_PROVIDER_SITE_OTHER): Payer: Self-pay

## 2023-06-29 ENCOUNTER — Telehealth: Payer: Self-pay | Admitting: *Deleted

## 2023-06-29 DIAGNOSIS — E7800 Pure hypercholesterolemia, unspecified: Secondary | ICD-10-CM

## 2023-06-29 NOTE — Telephone Encounter (Signed)
-----   Message from [name] sent at 06/29/2023  3:58 PM EDT ----- Regarding: Lab orders for Monday 9.9.24 Patient is scheduled for CPX labs, please order future labs, Thanks , Camelia Eng

## 2023-07-13 ENCOUNTER — Other Ambulatory Visit (INDEPENDENT_AMBULATORY_CARE_PROVIDER_SITE_OTHER): Payer: Medicare HMO

## 2023-07-13 DIAGNOSIS — E7800 Pure hypercholesterolemia, unspecified: Secondary | ICD-10-CM | POA: Diagnosis not present

## 2023-07-13 LAB — COMPREHENSIVE METABOLIC PANEL
ALT: 15 U/L (ref 0–35)
AST: 19 U/L (ref 0–37)
Albumin: 4 g/dL (ref 3.5–5.2)
Alkaline Phosphatase: 52 U/L (ref 39–117)
BUN: 10 mg/dL (ref 6–23)
CO2: 27 meq/L (ref 19–32)
Calcium: 9.6 mg/dL (ref 8.4–10.5)
Chloride: 100 meq/L (ref 96–112)
Creatinine, Ser: 0.93 mg/dL (ref 0.40–1.20)
GFR: 59.29 mL/min — ABNORMAL LOW (ref >60.00)
Glucose, Bld: 94 mg/dL (ref 70–99)
Potassium: 4 meq/L (ref 3.5–5.1)
Sodium: 137 meq/L (ref 135–145)
Total Bilirubin: 1.4 mg/dL — ABNORMAL HIGH (ref 0.2–1.2)
Total Protein: 7.7 g/dL (ref 6.0–8.3)

## 2023-07-13 LAB — LIPID PANEL
Cholesterol: 151 mg/dL (ref 0–200)
HDL: 49 mg/dL (ref >39.00)
LDL Cholesterol: 86 mg/dL (ref 0–99)
NonHDL: 102.34
Total CHOL/HDL Ratio: 3
Triglycerides: 84 mg/dL (ref 0.0–149.0)
VLDL: 16.8 mg/dL (ref 0.0–40.0)

## 2023-07-13 NOTE — Progress Notes (Signed)
No critical labs need to be addressed urgently. We will discuss labs in detail at upcoming office visit.   

## 2023-07-16 ENCOUNTER — Encounter: Payer: Medicare HMO | Admitting: Family Medicine

## 2023-07-23 ENCOUNTER — Encounter: Payer: Medicare HMO | Admitting: Family Medicine

## 2023-08-07 ENCOUNTER — Encounter: Payer: Medicare HMO | Admitting: Family Medicine

## 2023-08-09 ENCOUNTER — Other Ambulatory Visit: Payer: Self-pay | Admitting: Family Medicine

## 2023-08-10 NOTE — Telephone Encounter (Signed)
Patient had labs drawn on 07/13/2023,her cpe has been rescheduled a few times.

## 2023-08-10 NOTE — Telephone Encounter (Signed)
Please call patient does not look like she has appointment with health coach or labs before AWV with Dr. Ermalene Searing.

## 2023-08-28 ENCOUNTER — Ambulatory Visit (INDEPENDENT_AMBULATORY_CARE_PROVIDER_SITE_OTHER): Payer: Medicare HMO | Admitting: Family Medicine

## 2023-08-28 ENCOUNTER — Encounter: Payer: Self-pay | Admitting: Family Medicine

## 2023-08-28 VITALS — BP 120/80 | HR 87 | Temp 99.2°F | Ht 60.75 in | Wt 189.4 lb

## 2023-08-28 DIAGNOSIS — I10 Essential (primary) hypertension: Secondary | ICD-10-CM | POA: Diagnosis not present

## 2023-08-28 DIAGNOSIS — Z0000 Encounter for general adult medical examination without abnormal findings: Secondary | ICD-10-CM | POA: Diagnosis not present

## 2023-08-28 DIAGNOSIS — Z1231 Encounter for screening mammogram for malignant neoplasm of breast: Secondary | ICD-10-CM

## 2023-08-28 DIAGNOSIS — M8580 Other specified disorders of bone density and structure, unspecified site: Secondary | ICD-10-CM | POA: Diagnosis not present

## 2023-08-28 DIAGNOSIS — E7800 Pure hypercholesterolemia, unspecified: Secondary | ICD-10-CM

## 2023-08-28 DIAGNOSIS — Z23 Encounter for immunization: Secondary | ICD-10-CM | POA: Diagnosis not present

## 2023-08-28 NOTE — Progress Notes (Signed)
Patient ID: Danielle Kane, female    DOB: 1946-01-25, 77 y.o.   MRN: [id number]  This visit was conducted in person.  BP 120/80 (BP Location: Left Arm, Patient Position: Sitting, Cuff Size: Large)   Pulse 87   Temp 99.2 F (37.3 C) (Temporal)   Ht 5' 0.75" (1.543 m)   Wt 189 lb 6 oz (85.9 kg)   SpO2 99%   BMI 36.08 kg/m    CC:  Chief Complaint  Patient presents with   Annual Exam    (Has MWV schedule for 09/07/2023)    Subjective:   HPI: Danielle Kane is a 77 y.o. female presenting on 08/28/2023 for Annual Exam ((Has MWV schedule for 09/07/2023)) The patient presents for complete physical and review of chronic health problems. He/She also has the following acute concerns today:   Will have AMW in 09/2023  Flowsheet Row Office Visit from 08/28/2023 in [facility]  PHQ-2 Total Score 0      Hypertension:   At goal on hctz 25 mg daily  BP Readings from Last 3 Encounters:  08/28/23 120/80  07/11/22 130/80  07/11/21 120/70  Using medication without problems or lightheadedness:  none Chest pain with exertion: none Edema: none Short of breath: none Average home BPs: Other issues:  Diet:   moderate Exercise: walking some   She is not interested in starting a statin.  The 10-year ASCVD risk score (Arnett DK, et al., 2019) is: 22.6%   Values used to calculate the score:     Age: 53 years     Sex: Female     Is Non-Hispanic African American: No     Diabetic: No     Tobacco smoker: No     Systolic Blood Pressure: 120 mmHg     Is BP treated: Yes     HDL Cholesterol: 49 mg/dL     Total Cholesterol: 151 mg/dL  Wt Readings from Last 3 Encounters:  08/28/23 189 lb 6 oz (85.9 kg)  07/11/22 190 lb 8 oz (86.4 kg)  07/11/21 189 lb 12 oz (86.1 kg)      Relevant past medical, surgical, family and social history reviewed and updated as indicated. Interim medical history since our last visit reviewed. Allergies and medications reviewed  and updated. Outpatient Medications Prior to Visit  Medication Sig Dispense Refill   acetaminophen (TYLENOL) 500 MG tablet Take 500 mg by mouth every 6 (six) hours as needed.     Calcium Carbonate-Vitamin D 600-400 MG-UNIT tablet Take 1 tablet by mouth daily.       esomeprazole (NEXIUM) 20 MG capsule Take 20 mg by mouth daily at 12 noon.     hydrochlorothiazide (HYDRODIURIL) 25 MG tablet TAKE 1 TABLET (25 MG TOTAL) BY MOUTH DAILY. 90 tablet 3   ibuprofen (ADVIL,MOTRIN) 200 MG tablet Take 400 mg by mouth every 6 (six) hours as needed for moderate pain.     Multiple Vitamin (MULTIVITAMIN) tablet Take 1 tablet by mouth daily.       Simethicone 180 MG CAPS Take 180 mg by mouth daily as needed (for bloating).     No facility-administered medications prior to visit.     Per HPI unless specifically indicated in ROS section below Review of Systems  Constitutional:  Negative for fatigue and fever.  HENT:  Negative for congestion.   Eyes:  Negative for pain.  Respiratory:  Negative for cough and shortness of breath.   Cardiovascular:  Negative for chest pain, palpitations and leg swelling.  Gastrointestinal:  Negative for abdominal pain.  Genitourinary:  Negative for dysuria and vaginal bleeding.  Musculoskeletal:  Negative for back pain.  Neurological:  Negative for syncope, light-headedness and headaches.  Psychiatric/Behavioral:  Negative for dysphoric mood.    Objective:  BP 120/80 (BP Location: Left Arm, Patient Position: Sitting, Cuff Size: Large)   Pulse 87   Temp 99.2 F (37.3 C) (Temporal)   Ht 5' 0.75" (1.543 m)   Wt 189 lb 6 oz (85.9 kg)   SpO2 99%   BMI 36.08 kg/m   Wt Readings from Last 3 Encounters:  08/28/23 189 lb 6 oz (85.9 kg)  07/11/22 190 lb 8 oz (86.4 kg)  07/11/21 189 lb 12 oz (86.1 kg)      Physical Exam Vitals and nursing note reviewed.  Constitutional:      General: She is not in acute distress.    Appearance: Normal appearance. She is well-developed. She  is not ill-appearing or toxic-appearing.  HENT:     Head: Normocephalic.     Right Ear: Hearing, tympanic membrane, ear canal and external ear normal.     Left Ear: Hearing, tympanic membrane, ear canal and external ear normal.     Nose: Nose normal.  Eyes:     General: Lids are normal. Lids are everted, no foreign bodies appreciated.     Conjunctiva/sclera: Conjunctivae normal.     Pupils: Pupils are equal, round, and reactive to light.  Neck:     Thyroid: No thyroid mass or thyromegaly.     Vascular: No carotid bruit.     Trachea: Trachea normal.  Cardiovascular:     Rate and Rhythm: Normal rate and regular rhythm.     Heart sounds: Normal heart sounds, S1 normal and S2 normal. No murmur heard.    No gallop.  Pulmonary:     Effort: Pulmonary effort is normal. No respiratory distress.     Breath sounds: Normal breath sounds. No wheezing, rhonchi or rales.  Abdominal:     General: Bowel sounds are normal. There is no distension or abdominal bruit.     Palpations: Abdomen is soft. There is no fluid wave or mass.     Tenderness: There is no abdominal tenderness. There is no guarding or rebound.     Hernia: No hernia is present.  Musculoskeletal:     Cervical back: Normal range of motion and neck supple.  Lymphadenopathy:     Cervical: No cervical adenopathy.  Skin:    General: Skin is warm and dry.     Findings: No rash.  Neurological:     Mental Status: She is alert.     Cranial Nerves: No cranial nerve deficit.     Sensory: No sensory deficit.  Psychiatric:        Mood and Affect: Mood is not anxious or depressed.        Speech: Speech normal.        Behavior: Behavior normal. Behavior is cooperative.        Judgment: Judgment normal.       Results for orders placed or performed in visit on 07/13/23  Comprehensive metabolic panel  Result Value Ref Range   Sodium 137 135 - 145 mEq/L   Potassium 4.0 3.5 - 5.1 mEq/L   Chloride 100 96 - 112 mEq/L   CO2 27 19 - 32 mEq/L    Glucose, Bld 94 70 - 99 mg/dL   BUN 10  6 - 23 mg/dL   Creatinine, Ser 8.11 0.40 - 1.20 mg/dL   Total Bilirubin 1.4 (H) 0.2 - 1.2 mg/dL   Alkaline Phosphatase 52 39 - 117 U/L   AST 19 0 - 37 U/L   ALT 15 0 - 35 U/L   Total Protein 7.7 6.0 - 8.3 g/dL   Albumin 4.0 3.5 - 5.2 g/dL   GFR 91.47 (L) >82.95 mL/min   Calcium 9.6 8.4 - 10.5 mg/dL  Lipid panel  Result Value Ref Range   Cholesterol 151 0 - 200 mg/dL   Triglycerides 62.1 0.0 - 149.0 mg/dL   HDL 30.86 >57.84 mg/dL   VLDL 69.6 0.0 - 29.5 mg/dL   LDL Cholesterol 86 0 - 99 mg/dL   Total CHOL/HDL Ratio 3    NonHDL 102.34      COVID 19 screen:  No recent travel or known exposure to COVID19 The patient denies respiratory symptoms of COVID 19 at this time. The importance of social distancing was discussed today.   Assessment and Plan The patient's preventative maintenance and recommended screening tests for an annual wellness exam were reviewed in full today. Brought up to date unless services declined.  Counselled on the importance of diet, exercise, and its role in overall health and mortality. The patient's FH and SH was reviewed, including their home life, tobacco status, and drug and alcohol status.   Last DEXA 2017, due for repeat in 5 years, osteopenia.    DUE Mammogram: DUE Colonoscopy 2007, rpt in 10 years, 2017.   No family history.. Plan Q3 year cologuard instead... 07/2019 negatve , no further indicated. Vaccines: Uptodate with COVID19 seriesx 3 , plan  TDap at pharmacy, PNA , prevnar,  given flu shot today, had shingrix x 1 but lot of SE. DVE/pap: Not indicated.. S/P TAH hysterectomy.      Hep C: Done    Problem List Items Addressed This Visit     Essential hypertension, benign    Stable, chronic.  Continue current medication.   HCTZ 25 mg p.o. daily      High cholesterol    Increased 10-year risk of cardiovascular disease.  Statin indicated.  She is not interested in starting a statin medication at  this time despite discussion.   Info provided on natural supplements to lower cholesterol include red yeast rice recommended.  She will try this and consider rechecking cholesterol in 3 months.  She will work on low-cholesterol diet, regular exercise and weight loss.       Osteopenia   Relevant Orders   DG Bone Density   Other Visit Diagnoses     Routine general medical examination at a health care facility    -  Primary   Encounter for immunization       Relevant Orders   Flu Vaccine Trivalent High Dose (Fluad) (Completed)   Screening mammogram for breast cancer       Relevant Orders   MM 3D SCREENING MAMMOGRAM BILATERAL BREAST           Kerby Nora, MD

## 2023-08-28 NOTE — Patient Instructions (Addendum)
Please call the location of your choice from the menu below to schedule your Mammogram and/or Bone Density appointment.    [facility]   [facility]                      Phone:  [phone] [address]                               [place], [place] [address]                                                             Services: Traditional and 3D Mammogram, Bone Density   [facility]                 Phone: [phone] [address]                                                       [place], [place] [address]    Service: Bone Density ONLY   *this site does NOT perform mammograms  [facility]                        Phone:  [phone] [address]                                  [place], [place] [address]                                            Services:  3D Mammogram and Bone Density    Here is some info I have gathered for you for a trusted medical source. Lipid Management With Diet, Uptodate Feb 20.2021, [facility] and [facility]  Although earlier, smaller trials suggested a benefit of garlic supplementation, a subsequent larger trial failed to demonstrate improvement in lipids with use of any of three different garlic preparations (raw, powdered, or aged).  Bergamot: Improvements in serum lipids have been reported in trials of patients with metabolic syndrome, nonalcoholic fatty liver disease and in hyperlipidemic patients resistant to statin treatment. However, high-quality data on the effects of bergamot are lacking.  Suggestions for you if you would like to try natural supplements to lower cholesterol.  1.Souble fiber : Psyllium In a meta-analysis of randomized trials of patients with both normal and elevated cholesterol levels, the addition of 10.2 g/day of psyllium lowered the LDL cholesterol by an average of 12.8 mg/dL   2. Omega 3s: Mixed results in studies. Given you triglycerides are  normal I would not use this.  3.Red yeast rice ( 2.4 grams divided half in AM half in PM): Red yeast rice is a fermented rice product, most often taken as a supplement, which can improve serum cholesterol  via  method similar to prescription statins.  Red yeast rice  supplements lowered total cholesterol (208 versus 251 mg/dL) and LDL cholesterol (272 versus 175 mg/dL) compared with placebo.  4. Plant sterol.. There are naturally occurring sterols and stanols in nuts, legumes, whole grains, fruits, vegetables, and plant oils. In addition, a number of manufactured products enriched with plant sterols and stanols are commercially available. The margarines containing these compounds (eg, Benecol and Take Control spreads) have been available the longest and are the most studied  In a trial of 150 patients with mild hypercholesterolemia,those consuming the fortified margarine experienced a 10 to 14 percent decrease in total cholesterol and LDL cholesterol.  5.Green Tea Catechins: .n a year-long randomized trial of more than 900 healthy postmenopausal women, green tea catechin supplements (1315 mg catechins/day) reduced total cholesterol and LDL cholesterol, increased triglycerides, and had no effect on HDL cholesterol

## 2023-08-28 NOTE — Assessment & Plan Note (Signed)
Stable, chronic.  Continue current medication.  HCTZ 25 mg p.o. daily

## 2023-08-28 NOTE — Assessment & Plan Note (Addendum)
Increased 10-year risk of cardiovascular disease.  Statin indicated.  She is not interested in starting a statin medication at this time despite discussion.   Info provided on natural supplements to lower cholesterol include red yeast rice recommended.  She will try this and consider rechecking cholesterol in 3 months.  She will work on low-cholesterol diet, regular exercise and weight loss.

## 2024-01-28 DIAGNOSIS — D2272 Melanocytic nevi of left lower limb, including hip: Secondary | ICD-10-CM | POA: Diagnosis not present

## 2024-01-28 DIAGNOSIS — Z85828 Personal history of other malignant neoplasm of skin: Secondary | ICD-10-CM | POA: Diagnosis not present

## 2024-01-28 DIAGNOSIS — D235 Other benign neoplasm of skin of trunk: Secondary | ICD-10-CM | POA: Diagnosis not present

## 2024-01-28 DIAGNOSIS — D485 Neoplasm of uncertain behavior of skin: Secondary | ICD-10-CM | POA: Diagnosis not present

## 2024-01-28 DIAGNOSIS — D2262 Melanocytic nevi of left upper limb, including shoulder: Secondary | ICD-10-CM | POA: Diagnosis not present

## 2024-01-28 DIAGNOSIS — L821 Other seborrheic keratosis: Secondary | ICD-10-CM | POA: Diagnosis not present

## 2024-01-28 DIAGNOSIS — L570 Actinic keratosis: Secondary | ICD-10-CM | POA: Diagnosis not present

## 2024-01-28 DIAGNOSIS — D225 Melanocytic nevi of trunk: Secondary | ICD-10-CM | POA: Diagnosis not present

## 2024-01-28 DIAGNOSIS — L814 Other melanin hyperpigmentation: Secondary | ICD-10-CM | POA: Diagnosis not present

## 2024-03-14 ENCOUNTER — Encounter: Payer: Self-pay | Admitting: General Practice

## 2024-03-14 ENCOUNTER — Ambulatory Visit (INDEPENDENT_AMBULATORY_CARE_PROVIDER_SITE_OTHER): Admitting: General Practice

## 2024-03-14 ENCOUNTER — Ambulatory Visit: Payer: Self-pay

## 2024-03-14 VITALS — BP 122/70 | HR 94 | Temp 98.4°F | Ht 60.75 in | Wt 184.0 lb

## 2024-03-14 DIAGNOSIS — R300 Dysuria: Secondary | ICD-10-CM | POA: Insufficient documentation

## 2024-03-14 DIAGNOSIS — R310 Gross hematuria: Secondary | ICD-10-CM | POA: Diagnosis not present

## 2024-03-14 LAB — CBC
HCT: 42.3 % (ref 36.0–46.0)
Hemoglobin: 14.2 g/dL (ref 12.0–15.0)
MCHC: 33.5 g/dL (ref 30.0–36.0)
MCV: 90.6 fl (ref 78.0–100.0)
Platelets: 274 10*3/uL (ref 150.0–400.0)
RBC: 4.67 Mil/uL (ref 3.87–5.11)
RDW: 13.6 % (ref 11.5–15.5)
WBC: 10.5 10*3/uL (ref 4.0–10.5)

## 2024-03-14 LAB — POC URINALSYSI DIPSTICK (AUTOMATED)
Bilirubin, UA: NEGATIVE
Blood, UA: 200
Glucose, UA: NEGATIVE
Ketones, UA: NEGATIVE
Nitrite, UA: NEGATIVE
Protein, UA: POSITIVE — AB
Spec Grav, UA: 1.01 (ref 1.010–1.025)
Urobilinogen, UA: 0.2 U/dL
pH, UA: 6.5 (ref 5.0–8.0)

## 2024-03-14 LAB — BASIC METABOLIC PANEL WITH GFR
BUN: 11 mg/dL (ref 6–23)
CO2: 29 meq/L (ref 19–32)
Calcium: 9.6 mg/dL (ref 8.4–10.5)
Chloride: 98 meq/L (ref 96–112)
Creatinine, Ser: 0.75 mg/dL (ref 0.40–1.20)
GFR: 76.39 mL/min (ref >60.00)
Glucose, Bld: 87 mg/dL (ref 70–99)
Potassium: 4 meq/L (ref 3.5–5.1)
Sodium: 134 meq/L — ABNORMAL LOW (ref 135–145)

## 2024-03-14 MED ORDER — NITROFURANTOIN MONOHYD MACRO 100 MG PO CAPS: 100.0000 mg | ORAL_CAPSULE | Freq: Two times a day (BID) | ORAL | 0 refills | Status: AC

## 2024-03-14 NOTE — Progress Notes (Signed)
 Established Patient Office Visit  Subjective   Patient ID: Danielle Kane, female    DOB: [birth year]  Age: 78 y.o. MRN: [id number]  Chief Complaint  Patient presents with   Urinary Frequency    Started last night; patient noticed blood in urine this morning. Patient has been drinking cranberry juice and water this morning. Patient stated she was taking amoxicillin from the dentist last week and wasn't sure if this is what caused this or not.     Urinary Frequency  Associated symptoms include frequency, hematuria and urgency. Pertinent negatives include no chills, flank pain, nausea or vomiting.    Danielle Kane is a 78 year old female, patient of Dr. Cherlyn Cornet, with past medical history of GERD, HTN, osteopenia, presents today for an acute visit.   Gross Hematuria: symptom onset last night with urinary frequency, some burning with urination. She did see blood in toilet bowel after she voided this morning.  It occurred a few times but then stopped. She did start drinking cranberry juice and increased her water intake this morning. No fever, chills, nausea, or vomiting. She did take Amoxicillin last week from the dentist and is not sure if that could have caused a yesterday infection. Denies any vaginal discharge, burning or itching.   Patient Active Problem List   Diagnosis Date Noted   Dysuria 03/14/2024   Gross hematuria 03/14/2024   High cholesterol 07/14/2022   Right knee pain 07/10/2020   Palpitation 07/08/2019   Advanced care planning/counseling discussion 01/22/2016   BMI 35.0-35.9,adult 07/20/2015   Shingles 05/04/2014   Essential hypertension, benign 08/24/2008   Osteopenia 04/14/2008   GERD 06/25/2007   Past Medical History:  Diagnosis Date   GERD (gastroesophageal reflux disease)    Past Surgical History:  Procedure Laterality Date   ABDOMINAL HYSTERECTOMY     No Known Allergies       03/14/2024   12:04 PM 08/28/2023    3:55 PM 07/11/2022    3:37 PM  Depression  screen PHQ 2/9  Decreased Interest 0 0 0  Down, Depressed, Hopeless 0 0 0  PHQ - 2 Score 0 0 0  Altered sleeping 0    Tired, decreased energy 0    Change in appetite 0    Feeling bad or failure about yourself  0    Trouble concentrating 0    Moving slowly or fidgety/restless 0    Suicidal thoughts 0    PHQ-9 Score 0    Difficult doing work/chores Not difficult at all         03/14/2024   12:04 PM  GAD 7 : Generalized Anxiety Score  Nervous, Anxious, on Edge 0  Control/stop worrying 0  Worry too much - different things 0  Trouble relaxing 0  Restless 0  Easily annoyed or irritable 0  Afraid - awful might happen 0  Total GAD 7 Score 0  Anxiety Difficulty Not difficult at all      Review of Systems  Constitutional:  Negative for chills and fever.  Respiratory:  Negative for shortness of breath.   Cardiovascular:  Negative for chest pain.  Gastrointestinal:  Negative for abdominal pain, constipation, diarrhea, heartburn, nausea and vomiting.  Genitourinary:  Positive for dysuria, frequency, hematuria and urgency. Negative for flank pain.  Neurological:  Negative for dizziness and headaches.  Endo/Heme/Allergies:  Negative for polydipsia.  Psychiatric/Behavioral:  Negative for depression and suicidal ideas. The patient is not nervous/anxious.       Objective:  BP 122/70 (BP Location: Left Arm, Patient Position: Sitting, Cuff Size: Normal)   Pulse 94   Temp 98.4 F (36.9 C) (Oral)   Ht 5' 0.75" (1.543 m)   Wt 184 lb (83.5 kg)   SpO2 94%   BMI 35.05 kg/m  BP Readings from Last 3 Encounters:  03/14/24 122/70  08/28/23 120/80  07/11/22 130/80   Wt Readings from Last 3 Encounters:  03/14/24 184 lb (83.5 kg)  08/28/23 189 lb 6 oz (85.9 kg)  07/11/22 190 lb 8 oz (86.4 kg)      Physical Exam Vitals and nursing note reviewed.  Constitutional:      Appearance: Normal appearance.  Cardiovascular:     Rate and Rhythm: Normal rate and regular rhythm.      Pulses: Normal pulses.     Heart sounds: Normal heart sounds.  Pulmonary:     Effort: Pulmonary effort is normal.     Breath sounds: Normal breath sounds.  Abdominal:     General: Bowel sounds are normal.     Palpations: Abdomen is soft.     Tenderness: There is no right CVA tenderness or left CVA tenderness.  Neurological:     Mental Status: She is alert and oriented to person, place, and time.  Psychiatric:        Mood and Affect: Mood normal.        Behavior: Behavior normal.        Thought Content: Thought content normal.        Judgment: Judgment normal.      Results for orders placed or performed in visit on 03/14/24  POCT Urinalysis Dipstick (Automated)  Result Value Ref Range   Color, UA yellow    Clarity, UA clear    Glucose, UA Negative Negative   Bilirubin, UA neg    Ketones, UA neg    Spec Grav, UA 1.010 1.010 - 1.025   Blood, UA 200 Ery/uL    pH, UA 6.5 5.0 - 8.0   Protein, UA Positive (A) Negative   Urobilinogen, UA 0.2 0.2 or 1.0 E.U./dL   Nitrite, UA neg    Leukocytes, UA Trace (A) Negative       The 10-year ASCVD risk score (Arnett DK, et al., 2019) is: 25.8%    Assessment & Plan:  Dysuria Assessment & Plan: POC UA shows trace of leukocytes, blood and protein. Negative for nitrites. Urine culture pending.   Given symptoms, presentation today, discussed starting treatment.   Start Macrobid 100 mg BID for 5 days.  Patient advised that once urine culture comes back, we may need to change antibiotics. Verbalizes understanding.  Patient advised to increase water intake. Ok to continue cranberry juice in moderation.  Wet prep pending to rule out yeast infection secondary to antibiotic use.  Cbc and BMP pending. ER precautions given to patient.  Orders: -     POCT Urinalysis Dipstick (Automated) -     Nitrofurantoin Monohyd Macro; Take 1 capsule (100 mg total) by mouth 2 (two) times daily for 5 days.  Dispense: 10 capsule; Refill: 0 -     WET  PREP BY MOLECULAR PROBE -     Urine Culture  Gross hematuria Assessment & Plan: POC UA shows trace of leukocytes, blood and protein. Negative for nitrites. Urine culture pending.   Given symptoms, presentation today, discussed starting treatment.   Start Macrobid 100 mg BID for 5 days.  Patient advised that once urine culture comes back, we may need to  change antibiotics. Verbalizes understanding.  Patient advised to increase water intake. Ok to continue cranberry juice in moderation.  Wet prep pending to rule out yeast infection secondary to antibiotic use.  Cbc and BMP pending.  Orders: -     Nitrofurantoin Monohyd Macro; Take 1 capsule (100 mg total) by mouth 2 (two) times daily for 5 days.  Dispense: 10 capsule; Refill: 0 -     CBC -     Basic metabolic panel with GFR     Return if symptoms worsen or fail to improve.    Jolanda Nation, NP

## 2024-03-14 NOTE — Assessment & Plan Note (Addendum)
 POC UA shows trace of leukocytes, blood and protein. Negative for nitrites. Urine culture pending.   Given symptoms, presentation today, discussed starting treatment.   Start Macrobid 100 mg BID for 5 days.  Patient advised that once urine culture comes back, we may need to change antibiotics. Verbalizes understanding.  Patient advised to increase water intake. Ok to continue cranberry juice in moderation.  Wet prep pending to rule out yeast infection secondary to antibiotic use.  Cbc and BMP pending. ER precautions given to patient.

## 2024-03-14 NOTE — Telephone Encounter (Signed)
 Copied from CRM [id number]. Topic: Clinical - Red Word Triage >> Mar 14, 2024  8:04 AM Antwanette L wrote: Red Word that prompted transfer to Nurse Triage: blood in urine  Chief Complaint: blood in urine, frequency Symptoms: see above Frequency: since last night Pertinent Negatives: Patient denies fever, flank pain Disposition: [] ED /[] Urgent Care (no appt availability in office) / [x] Appointment(In office/virtual)/ []  [facility]/ [] Home Care/ [] Refused Recommended Disposition /[] [facility]/ []  Follow-up with PCP Additional Notes: apt made for today; care advice given, denies questions; instructed to go to ER if becomes worse.   Reason for Disposition  [1] Pink or red-colored urine and likely from food (beets, rhubarb, red food dye) AND [2] lasts > 24 hours after stopping food  Answer Assessment - Initial Assessment Questions 1. COLOR of URINE: "Describe the color of the urine."  (e.g., tea-colored, pink, red, bloody) "Do you have blood clots in your urine?" (e.g., none, pea, grape, small coin)     Light red 2. ONSET: "When did the bleeding start?"      Last night 3. EPISODES: "How many times has there been blood in the urine?" or "How many times today?"     Every time 4. PAIN with URINATION: "Is there any pain with passing your urine?" If Yes, ask: "How bad is the pain?"  (Scale 1-10; or mild, moderate, severe)    - MILD: Complains slightly about urination hurting.    - MODERATE: Interferes with normal activities.      - SEVERE: Excruciating, unwilling or unable to urinate because of the pain.      Burns, mild 5. FEVER: "Do you have a fever?" If Yes, ask: "What is your temperature, how was it measured, and when did it start?"     denies 6. ASSOCIATED SYMPTOMS: "Are you passing urine more frequently than usual?"     yes 7. OTHER SYMPTOMS: "Do you have any other symptoms?" (e.g., back/flank pain, abdomen pain, vomiting)     no 8. PREGNANCY: "Is there any chance  you are pregnant?" "When was your last menstrual period?"     na  Protocols used: Urine - Blood In-A-AH

## 2024-03-14 NOTE — Patient Instructions (Signed)
 Stop by the lab prior to leaving today. I will notify you of your results once received.   Start Macrobid (nitrofurantoin) tablets for urinary tract infection. Take 1 tablet by mouth twice daily for 5 days.  Increase water intake.   As discussed, please go to the ER if you develop fever, chills, confusion, nausea or vomiting.   I will be in touch with the results.   It was a pleasure meeting you!

## 2024-03-14 NOTE — Telephone Encounter (Signed)
 Noted.

## 2024-03-14 NOTE — Assessment & Plan Note (Signed)
 POC UA shows trace of leukocytes, blood and protein. Negative for nitrites. Urine culture pending.   Given symptoms, presentation today, discussed starting treatment.   Start Macrobid 100 mg BID for 5 days.  Patient advised that once urine culture comes back, we may need to change antibiotics. Verbalizes understanding.  Patient advised to increase water intake. Ok to continue cranberry juice in moderation.  Wet prep pending to rule out yeast infection secondary to antibiotic use.  Cbc and BMP pending.

## 2024-03-15 ENCOUNTER — Other Ambulatory Visit: Payer: Self-pay | Admitting: General Practice

## 2024-03-15 ENCOUNTER — Ambulatory Visit: Payer: Self-pay | Admitting: General Practice

## 2024-03-15 DIAGNOSIS — B379 Candidiasis, unspecified: Secondary | ICD-10-CM

## 2024-03-15 MED ORDER — FLUCONAZOLE 150 MG PO TABS: 150.0000 mg | ORAL_TABLET | Freq: Once | ORAL | 0 refills | Status: AC

## 2024-03-16 LAB — WET PREP BY MOLECULAR PROBE
Candida species: DETECTED — AB
Gardnerella vaginalis: NOT DETECTED
MICRO NUMBER:: 16442997
SPECIMEN QUALITY:: ADEQUATE
Trichomonas vaginosis: NOT DETECTED

## 2024-03-16 LAB — URINE CULTURE
MICRO NUMBER:: 16442998
SPECIMEN QUALITY:: ADEQUATE

## 2024-06-23 ENCOUNTER — Ambulatory Visit (INDEPENDENT_AMBULATORY_CARE_PROVIDER_SITE_OTHER): Admitting: Family Medicine

## 2024-06-23 ENCOUNTER — Encounter: Payer: Self-pay | Admitting: Family Medicine

## 2024-06-23 ENCOUNTER — Ambulatory Visit: Payer: Self-pay

## 2024-06-23 VITALS — BP 134/78 | HR 83 | Temp 98.1°F | Ht 60.75 in | Wt 183.4 lb

## 2024-06-23 DIAGNOSIS — L237 Allergic contact dermatitis due to plants, except food: Secondary | ICD-10-CM | POA: Diagnosis not present

## 2024-06-23 MED ORDER — PREDNISONE 20 MG PO TABS: ORAL_TABLET | ORAL | 0 refills | Status: DC

## 2024-06-23 MED ORDER — TRIAMCINOLONE ACETONIDE 0.5 % EX CREA
1.0000 | TOPICAL_CREAM | Freq: Two times a day (BID) | CUTANEOUS | 0 refills | Status: DC
Start: 2024-06-23 — End: 2024-08-30

## 2024-06-23 NOTE — Telephone Encounter (Signed)
 Noted

## 2024-06-23 NOTE — Assessment & Plan Note (Signed)
 Acute, most likely plant contact dermatitis.  Crosses the midline so doubt it is shingles despite appearance on forehead. Will treat with prednisone  taper and topical triamcinolone  cream twice daily.  No sign of respiratory compromise.  Return and ER precautions provided.

## 2024-06-23 NOTE — Progress Notes (Signed)
 Patient ID: Danielle Kane, female    DOB: November 20, 1945, 78 y.o.   MRN: [id number]  This visit was conducted in person.  BP 134/78   Pulse 83   Temp 98.1 F (36.7 C) (Oral)   Ht 5' 0.75 (1.543 m)   Wt 183 lb 6 oz (83.2 kg)   SpO2 97%   BMI 34.93 kg/m    CC:  Chief Complaint  Patient presents with   Rash    C/o rash on face and anterior/posterior neck. Areas are itchy. Noticed 06/21/24. Thinks rash is due to poison oak.     Subjective:   HPI: Danielle Kane is a 78 y.o. female presenting on 06/23/2024 for Rash (C/o rash on face and anterior/posterior neck. Areas are itchy. Noticed 06/21/24. Thinks rash is due to poison oak. )   New onset rash on face and  bilateral anterior neck.  Started 3 days ago. Very itchy.   In last week she was around cut poision ivy/oak. Washed off patio where it was.    No fever. No flu like symptoms.  No oral swelling.    She has applied cortisone, and benadryl orally.    Relevant past medical, surgical, family and social history reviewed and updated as indicated. Interim medical history since our last visit reviewed. Allergies and medications reviewed and updated. Outpatient Medications Prior to Visit  Medication Sig Dispense Refill   acetaminophen  (TYLENOL ) 500 MG tablet Take 500 mg by mouth every 6 (six) hours as needed.     Calcium Carbonate-Vitamin D  600-400 MG-UNIT tablet Take 1 tablet by mouth daily.       esomeprazole  (NEXIUM ) 20 MG capsule Take 20 mg by mouth daily at 12 noon.     hydrochlorothiazide  (HYDRODIURIL ) 25 MG tablet TAKE 1 TABLET (25 MG TOTAL) BY MOUTH DAILY. 90 tablet 3   ibuprofen  (ADVIL ,MOTRIN ) 200 MG tablet Take 400 mg by mouth every 6 (six) hours as needed for moderate pain.     Multiple Vitamin (MULTIVITAMIN) tablet Take 1 tablet by mouth daily.       Simethicone 180 MG CAPS Take 180 mg by mouth daily as needed (for bloating).     No facility-administered medications prior to visit.     Per HPI unless specifically  indicated in ROS section below Review of Systems  Constitutional:  Negative for fatigue and fever.  HENT:  Negative for congestion.   Eyes:  Negative for pain.  Respiratory:  Negative for cough and shortness of breath.   Cardiovascular:  Negative for chest pain, palpitations and leg swelling.  Gastrointestinal:  Negative for abdominal pain.  Genitourinary:  Negative for dysuria and vaginal bleeding.  Musculoskeletal:  Negative for back pain.  Neurological:  Negative for syncope, light-headedness and headaches.  Psychiatric/Behavioral:  Negative for dysphoric mood.    Objective:  BP 134/78   Pulse 83   Temp 98.1 F (36.7 C) (Oral)   Ht 5' 0.75 (1.543 m)   Wt 183 lb 6 oz (83.2 kg)   SpO2 97%   BMI 34.93 kg/m   Wt Readings from Last 3 Encounters:  06/23/24 183 lb 6 oz (83.2 kg)  03/14/24 184 lb (83.5 kg)  08/28/23 189 lb 6 oz (85.9 kg)      Physical Exam Constitutional:      General: She is not in acute distress.    Appearance: Normal appearance. She is well-developed. She is not ill-appearing or toxic-appearing.  HENT:     Head: Normocephalic.  Right Ear: Hearing, tympanic membrane, ear canal and external ear normal. Tympanic membrane is not erythematous, retracted or bulging.     Left Ear: Hearing, tympanic membrane, ear canal and external ear normal. Tympanic membrane is not erythematous, retracted or bulging.     Nose: No mucosal edema or rhinorrhea.     Right Sinus: No maxillary sinus tenderness or frontal sinus tenderness.     Left Sinus: No maxillary sinus tenderness or frontal sinus tenderness.     Mouth/Throat:     Pharynx: Uvula midline.  Eyes:     General: Lids are normal. Lids are everted, no foreign bodies appreciated.     Conjunctiva/sclera: Conjunctivae normal.     Pupils: Pupils are equal, round, and reactive to light.  Neck:     Thyroid: No thyroid mass or thyromegaly.     Vascular: No carotid bruit.     Trachea: Trachea normal.  Cardiovascular:      Rate and Rhythm: Normal rate and regular rhythm.     Pulses: Normal pulses.     Heart sounds: Normal heart sounds, S1 normal and S2 normal. No murmur heard.    No friction rub. No gallop.  Pulmonary:     Effort: Pulmonary effort is normal. No tachypnea or respiratory distress.     Breath sounds: Normal breath sounds. No decreased breath sounds, wheezing, rhonchi or rales.  Abdominal:     General: Bowel sounds are normal.     Palpations: Abdomen is soft.     Tenderness: There is no abdominal tenderness.  Musculoskeletal:     Cervical back: Normal range of motion and neck supple.  Skin:    General: Skin is warm and dry.     Findings: Rash present.         Comments:  See photo  Neurological:     Mental Status: She is alert.  Psychiatric:        Mood and Affect: Mood is not anxious or depressed.        Speech: Speech normal.        Behavior: Behavior normal. Behavior is cooperative.        Thought Content: Thought content normal.        Judgment: Judgment normal.          Results for orders placed or performed in visit on 03/14/24  POCT Urinalysis Dipstick (Automated)   Collection Time: 03/14/24 12:04 PM  Result Value Ref Range   Color, UA yellow    Clarity, UA clear    Glucose, UA Negative Negative   Bilirubin, UA neg    Ketones, UA neg    Spec Grav, UA 1.010 1.010 - 1.025   Blood, UA 200 Ery/uL    pH, UA 6.5 5.0 - 8.0   Protein, UA Positive (A) Negative   Urobilinogen, UA 0.2 0.2 or 1.0 E.U./dL   Nitrite, UA neg    Leukocytes, UA Trace (A) Negative  WET PREP BY MOLECULAR PROBE   Collection Time: 03/14/24 12:26 PM   Specimen: Blood  Result Value Ref Range   MICRO NUMBER: [id number]    SPECIMEN QUALITY: Adequate    SOURCE: NOT GIVEN    STATUS: FINAL    Trichomonas vaginosis Not Detected    Gardnerella vaginalis Not Detected    Candida species Detected (A)   Urine Culture   Collection Time: 03/14/24 12:26 PM   Specimen: Blood  Result Value Ref Range   MICRO  NUMBER: [id number]    SPECIMEN QUALITY: Adequate  Sample Source URINE    STATUS: FINAL    ISOLATE 1: Escherichia coli (A)       Susceptibility   Escherichia coli - URINE CULTURE, REFLEX    AMOX/CLAVULANIC 16 Intermediate     AMPICILLIN/SULBACTAM >=32 Resistant     CEFAZOLIN* <=4 Not Reportable      * For infections other than uncomplicated UTI caused by E. coli, K. pneumoniae or P. mirabilis: Cefazolin is resistant if MIC > or = 8 mcg/mL. (Distinguishing susceptible versus intermediate for isolates with MIC < or = 4 mcg/mL requires additional testing.) For uncomplicated UTI caused by E. coli, K. pneumoniae or P. mirabilis: Cefazolin is susceptible if MIC <32 mcg/mL and predicts susceptible to the oral agents cefaclor, cefdinir, cefpodoxime, cefprozil, cefuroxime, cephalexin  and loracarbef.     CEFTAZIDIME <=1 Sensitive     CEFEPIME <=0.12 Sensitive     CEFTRIAXONE <=0.25 Sensitive     CIPROFLOXACIN 0.12 Sensitive     LEVOFLOXACIN 0.25 Sensitive     GENTAMICIN <=1 Sensitive     IMIPENEM <=0.25 Sensitive     MEROPENEM <=0.25 Sensitive     NITROFURANTOIN  <=16 Sensitive     PIP/TAZO <=4 Sensitive     TRIMETH/SULFA* <=20 Sensitive      * For infections other than uncomplicated UTI caused by E. coli, K. pneumoniae or P. mirabilis: Cefazolin is resistant if MIC > or = 8 mcg/mL. (Distinguishing susceptible versus intermediate for isolates with MIC < or = 4 mcg/mL requires additional testing.) For uncomplicated UTI caused by E. coli, K. pneumoniae or P. mirabilis: Cefazolin is susceptible if MIC <32 mcg/mL and predicts susceptible to the oral agents cefaclor, cefdinir, cefpodoxime, cefprozil, cefuroxime, cephalexin  and loracarbef. Legend: S = Susceptible  I = Intermediate R = Resistant  NS = Not susceptible SDD = Susceptible Dose Dependent * = Not Tested  NR = Not Reported **NN = See Therapy Comments   CBC   Collection Time: 03/14/24 12:26 PM  Result Value Ref Range    WBC 10.5 4.0 - 10.5 K/uL   RBC 4.67 3.87 - 5.11 Mil/uL   Platelets 274.0 150.0 - 400.0 K/uL   Hemoglobin 14.2 12.0 - 15.0 g/dL   HCT 57.6 63.9 - 53.9 %   MCV 90.6 78.0 - 100.0 fl   MCHC 33.5 30.0 - 36.0 g/dL   RDW 86.3 88.4 - 84.4 %  Basic Metabolic Panel   Collection Time: 03/14/24 12:26 PM  Result Value Ref Range   Sodium 134 (L) 135 - 145 mEq/L   Potassium 4.0 3.5 - 5.1 mEq/L   Chloride 98 96 - 112 mEq/L   CO2 29 19 - 32 mEq/L   Glucose, Bld 87 70 - 99 mg/dL   BUN 11 6 - 23 mg/dL   Creatinine, Ser 9.24 0.40 - 1.20 mg/dL   GFR 23.60 >39.99 mL/min   Calcium 9.6 8.4 - 10.5 mg/dL    Assessment and Plan  Plant allergic contact dermatitis Assessment & Plan: Acute, most likely plant contact dermatitis.  Crosses the midline so doubt it is shingles despite appearance on forehead. Will treat with prednisone  taper and topical triamcinolone  cream twice daily.  No sign of respiratory compromise.  Return and ER precautions provided.   Other orders -     predniSONE ; 3 tabs by mouth daily x 3 days, then 2 tabs by mouth daily x 2 days then 1 tab by mouth daily x 2 days  Dispense: 15 tablet; Refill: 0 -     Triamcinolone  Acetonide;  Apply 1 Application topically 2 (two) times daily.  Dispense: 30 g; Refill: 0    No follow-ups on file.   Greig Ring, MD

## 2024-06-23 NOTE — Telephone Encounter (Signed)
 FYI Only or Action Required?: FYI only for provider.  Patient was last seen in primary care on 03/14/2024 by Vincente Shivers, NP.  Called Nurse Triage reporting Rash. Might be poison Oak  Symptoms began yesterday.OTC medications:  Hydrocortisone and benadryl  Interventions attempted: SABRA  Symptoms are: gradually worsening.  Triage Disposition: See PCP When Office is Open (Within 3 Days)  Patient/caregiver understands and will follow disposition?: Yes                 Patient has rash on face. Rash is located on forehead and down to her right ear. Very itchy no pain. Noticed it yesterday morning. Please call patient at [phone]  Reason for Disposition  [1] Severe localized itching AND [2] after 2 days of steroid cream  Answer Assessment - Initial Assessment Questions 1. APPEARANCE of RASH: What does the rash look like? (e.g., blisters, dry flaky skin, red spots, redness, sores)     red 2. LOCATION: Where is the rash located?      Face - down face to ear. 3. NUMBER: How many spots are there?      Red streaks 4. SIZE: How big are the spots? (e.g., inches, cm; or compare to size of pinhead, tip of pen, eraser, pea)      Splotch on forehead and 2 lines down side of face. And splotches on neck. 5. ONSET: When did the rash start?      yesterday 6. ITCHING: Does the rash itch? If Yes, ask: How bad is the itch?  (Scale 0-10; or none, mild, moderate, severe)     yes 7. PAIN: Does the rash hurt? If Yes, ask: How bad is the pain?  (Scale 0-10; or none, mild, moderate, severe)     No pain 8. OTHER SYMPTOMS: Do you have any other symptoms? (e.g., fever)     no  Protocols used: Rash or Redness - Localized-A-AH

## 2024-07-19 DIAGNOSIS — C44722 Squamous cell carcinoma of skin of right lower limb, including hip: Secondary | ICD-10-CM | POA: Diagnosis not present

## 2024-07-19 DIAGNOSIS — Z85828 Personal history of other malignant neoplasm of skin: Secondary | ICD-10-CM | POA: Diagnosis not present

## 2024-08-05 ENCOUNTER — Telehealth: Payer: Self-pay | Admitting: *Deleted

## 2024-08-05 DIAGNOSIS — E7800 Pure hypercholesterolemia, unspecified: Secondary | ICD-10-CM

## 2024-08-05 NOTE — Telephone Encounter (Signed)
-----   Message from Harlene Du sent at 08/05/2024  2:20 PM EDT ----- Regarding: Lab Tues 08/23/24 Hello,  Patient is coming in for CPE labs on Tuesday 08/23/24. Can we get orders please.   Thanks

## 2024-08-12 ENCOUNTER — Other Ambulatory Visit: Payer: Self-pay | Admitting: Family Medicine

## 2024-08-23 ENCOUNTER — Ambulatory Visit: Payer: Self-pay | Admitting: Family Medicine

## 2024-08-23 ENCOUNTER — Other Ambulatory Visit (INDEPENDENT_AMBULATORY_CARE_PROVIDER_SITE_OTHER): Payer: Medicare HMO

## 2024-08-23 DIAGNOSIS — E7800 Pure hypercholesterolemia, unspecified: Secondary | ICD-10-CM | POA: Diagnosis not present

## 2024-08-23 LAB — COMPREHENSIVE METABOLIC PANEL WITH GFR
ALT: 14 U/L (ref 0–35)
AST: 16 U/L (ref 0–37)
Albumin: 4.1 g/dL (ref 3.5–5.2)
Alkaline Phosphatase: 53 U/L (ref 39–117)
BUN: 11 mg/dL (ref 6–23)
CO2: 29 meq/L (ref 19–32)
Calcium: 9.3 mg/dL (ref 8.4–10.5)
Chloride: 99 meq/L (ref 96–112)
Creatinine, Ser: 0.78 mg/dL (ref 0.40–1.20)
GFR: 72.65 mL/min (ref >60.00)
Glucose, Bld: 96 mg/dL (ref 70–99)
Potassium: 4.1 meq/L (ref 3.5–5.1)
Sodium: 137 meq/L (ref 135–145)
Total Bilirubin: 1.2 mg/dL (ref 0.2–1.2)
Total Protein: 7.1 g/dL (ref 6.0–8.3)

## 2024-08-23 LAB — LIPID PANEL
Cholesterol: 150 mg/dL (ref 0–200)
HDL: 47.8 mg/dL (ref >39.00)
LDL Cholesterol: 83 mg/dL (ref 0–99)
NonHDL: 101.77
Total CHOL/HDL Ratio: 3
Triglycerides: 95 mg/dL (ref 0.0–149.0)
VLDL: 19 mg/dL (ref 0.0–40.0)

## 2024-08-23 NOTE — Progress Notes (Signed)
 No critical labs need to be addressed urgently. We will discuss labs in detail at upcoming office visit.

## 2024-08-30 ENCOUNTER — Ambulatory Visit: Payer: Medicare HMO | Admitting: Family Medicine

## 2024-08-30 ENCOUNTER — Encounter: Payer: Self-pay | Admitting: Family Medicine

## 2024-08-30 VITALS — BP 120/84 | HR 94 | Temp 98.4°F | Ht 60.75 in | Wt 184.4 lb

## 2024-08-30 DIAGNOSIS — E7800 Pure hypercholesterolemia, unspecified: Secondary | ICD-10-CM | POA: Diagnosis not present

## 2024-08-30 DIAGNOSIS — M8580 Other specified disorders of bone density and structure, unspecified site: Secondary | ICD-10-CM | POA: Diagnosis not present

## 2024-08-30 DIAGNOSIS — Z23 Encounter for immunization: Secondary | ICD-10-CM

## 2024-08-30 DIAGNOSIS — Z0000 Encounter for general adult medical examination without abnormal findings: Secondary | ICD-10-CM | POA: Diagnosis not present

## 2024-08-30 DIAGNOSIS — I10 Essential (primary) hypertension: Secondary | ICD-10-CM

## 2024-08-30 DIAGNOSIS — Z85828 Personal history of other malignant neoplasm of skin: Secondary | ICD-10-CM

## 2024-08-30 NOTE — Assessment & Plan Note (Signed)
Stable, chronic.  Continue current medication.  HCTZ 25 mg p.o. daily

## 2024-08-30 NOTE — Progress Notes (Signed)
 Patient ID: Danielle Kane, female    DOB: 06-16-1946, 78 y.o.   MRN: [id number]  This visit was conducted in person.  BP 120/84   Pulse 94   Temp 98.4 F (36.9 C) (Temporal)   Ht 5' 0.75 (1.543 m)   Wt 184 lb 6 oz (83.6 kg)   SpO2 98%   BMI 35.12 kg/m    CC:  Chief Complaint  Patient presents with   Annual Exam    Subjective:   HPI: Danielle Kane is a 78 y.o. female presenting on 08/30/2024 for Annual Exam The patient presents for complete physical and review of chronic health problems. He/She also has the following acute concerns today: none    AMW to be scheduled   [employer] Visit from 08/30/2024 in [facility]  PHQ-2 Total Score 0   Hypertension:   At goal on hctz 25 mg daily  BP Readings from Last 3 Encounters:  08/30/24 120/84  06/23/24 134/78  03/14/24 122/70  Using medication without problems or lightheadedness:  none Chest pain with exertion: none Edema:  occ if sitting to long Short of breath: none Average home BPs: Other issues:  Diet:   moderate, working on ow chol diet and low carb diet. Exercise: walking  a little.   High cholesterol  Lab Results  Component Value Date   CHOL 150 08/23/2024   HDL 47.80 08/23/2024   LDLCALC 83 08/23/2024   TRIG 95.0 08/23/2024   CHOLHDL 3 08/23/2024     She is not interested in starting a statin.  The 10-year ASCVD risk score (Arnett DK, et al., 2019) is: 25%   Values used to calculate the score:     Age: 41 years     Clincally relevant sex: Female     Is Non-Hispanic African American: No     Diabetic: No     Tobacco smoker: No     Systolic Blood Pressure: 120 mmHg     Is BP treated: Yes     HDL Cholesterol: 47.8 mg/dL     Total Cholesterol: 150 mg/dL  Wt Readings from Last 3 Encounters:  08/30/24 184 lb 6 oz (83.6 kg)  06/23/24 183 lb 6 oz (83.2 kg)  03/14/24 184 lb (83.5 kg)      Relevant past medical, surgical, family and social history reviewed  and updated as indicated. Interim medical history since our last visit reviewed. Allergies and medications reviewed and updated. Outpatient Medications Prior to Visit  Medication Sig Dispense Refill   acetaminophen  (TYLENOL ) 500 MG tablet Take 500 mg by mouth every 6 (six) hours as needed.     Calcium Carbonate-Vitamin D  600-400 MG-UNIT tablet Take 1 tablet by mouth daily.       esomeprazole  (NEXIUM ) 20 MG capsule Take 20 mg by mouth daily at 12 noon.     hydrochlorothiazide  (HYDRODIURIL ) 25 MG tablet TAKE 1 TABLET (25 MG TOTAL) BY MOUTH DAILY. 90 tablet 0   ibuprofen  (ADVIL ,MOTRIN ) 200 MG tablet Take 400 mg by mouth every 6 (six) hours as needed for moderate pain.     Multiple Vitamin (MULTIVITAMIN) tablet Take 1 tablet by mouth daily.       Simethicone 180 MG CAPS Take 180 mg by mouth daily as needed (for bloating).     predniSONE  (DELTASONE ) 20 MG tablet 3 tabs by mouth daily x 3 days, then 2 tabs by mouth daily x 2 days then 1 tab by mouth  daily x 2 days 15 tablet 0   triamcinolone  cream (KENALOG ) 0.5 % Apply 1 Application topically 2 (two) times daily. 30 g 0   No facility-administered medications prior to visit.     Per HPI unless specifically indicated in ROS section below Review of Systems  Constitutional:  Negative for fatigue and fever.  HENT:  Negative for congestion.   Eyes:  Negative for pain.  Respiratory:  Negative for cough and shortness of breath.   Cardiovascular:  Negative for chest pain, palpitations and leg swelling.  Gastrointestinal:  Negative for abdominal pain.  Genitourinary:  Negative for dysuria and vaginal bleeding.  Musculoskeletal:  Negative for back pain.  Neurological:  Negative for syncope, light-headedness and headaches.  Psychiatric/Behavioral:  Negative for dysphoric mood.    Objective:  BP 120/84   Pulse 94   Temp 98.4 F (36.9 C) (Temporal)   Ht 5' 0.75 (1.543 m)   Wt 184 lb 6 oz (83.6 kg)   SpO2 98%   BMI 35.12 kg/m   Wt Readings from  Last 3 Encounters:  08/30/24 184 lb 6 oz (83.6 kg)  06/23/24 183 lb 6 oz (83.2 kg)  03/14/24 184 lb (83.5 kg)      Physical Exam Vitals and nursing note reviewed.  Constitutional:      General: She is not in acute distress.    Appearance: Normal appearance. She is well-developed. She is not ill-appearing or toxic-appearing.  HENT:     Head: Normocephalic.     Right Ear: Hearing, tympanic membrane, ear canal and external ear normal.     Left Ear: Hearing, tympanic membrane, ear canal and external ear normal.     Nose: Nose normal.  Eyes:     General: Lids are normal. Lids are everted, no foreign bodies appreciated.     Conjunctiva/sclera: Conjunctivae normal.     Pupils: Pupils are equal, round, and reactive to light.  Neck:     Thyroid: No thyroid mass or thyromegaly.     Vascular: No carotid bruit.     Trachea: Trachea normal.  Cardiovascular:     Rate and Rhythm: Normal rate and regular rhythm.     Heart sounds: Normal heart sounds, S1 normal and S2 normal. No murmur heard.    No gallop.  Pulmonary:     Effort: Pulmonary effort is normal. No respiratory distress.     Breath sounds: Normal breath sounds. No wheezing, rhonchi or rales.  Abdominal:     General: Bowel sounds are normal. There is no distension or abdominal bruit.     Palpations: Abdomen is soft. There is no fluid wave or mass.     Tenderness: There is no abdominal tenderness. There is no guarding or rebound.     Hernia: No hernia is present.  Musculoskeletal:     Cervical back: Normal range of motion and neck supple.  Lymphadenopathy:     Cervical: No cervical adenopathy.  Skin:    General: Skin is warm and dry.     Findings: No rash.  Neurological:     Mental Status: She is alert.     Cranial Nerves: No cranial nerve deficit.     Sensory: No sensory deficit.  Psychiatric:        Mood and Affect: Mood is not anxious or depressed.        Speech: Speech normal.        Behavior: Behavior normal. Behavior  is cooperative.        Judgment:  Judgment normal.       Results for orders placed or performed in visit on 08/23/24  Comprehensive metabolic panel   Collection Time: 08/23/24  7:25 AM  Result Value Ref Range   Sodium 137 135 - 145 mEq/L   Potassium 4.1 3.5 - 5.1 mEq/L   Chloride 99 96 - 112 mEq/L   CO2 29 19 - 32 mEq/L   Glucose, Bld 96 70 - 99 mg/dL   BUN 11 6 - 23 mg/dL   Creatinine, Ser 9.21 0.40 - 1.20 mg/dL   Total Bilirubin 1.2 0.2 - 1.2 mg/dL   Alkaline Phosphatase 53 39 - 117 U/L   AST 16 0 - 37 U/L   ALT 14 0 - 35 U/L   Total Protein 7.1 6.0 - 8.3 g/dL   Albumin 4.1 3.5 - 5.2 g/dL   GFR 27.34 >39.99 mL/min   Calcium 9.3 8.4 - 10.5 mg/dL  Lipid panel   Collection Time: 08/23/24  7:25 AM  Result Value Ref Range   Cholesterol 150 0 - 200 mg/dL   Triglycerides 04.9 0.0 - 149.0 mg/dL   HDL 52.19 >60.99 mg/dL   VLDL 80.9 0.0 - 59.9 mg/dL   LDL Cholesterol 83 0 - 99 mg/dL   Total CHOL/HDL Ratio 3    NonHDL 101.77      COVID 19 screen:  No recent travel or known exposure to COVID19 The patient denies respiratory symptoms of COVID 19 at this time. The importance of social distancing was discussed today.   Assessment and Plan The patient's preventative maintenance and recommended screening tests for an annual wellness exam were reviewed in full today. Brought up to date unless services declined.  Counselled on the importance of diet, exercise, and its role in overall health and mortality. The patient's FH and SH was reviewed, including their home life, tobacco status, and drug and alcohol status.   Last DEXA 2017, due for repeat in 5 years, osteopenia.    DUE Mammogram: DUE Colonoscopy 2007, rpt in 10 years, 2017.   No family history.. Plan Q3 year cologuard instead... 07/2019 negatve , no further indicated. Vaccines: Uptodate with COVID19 seriesx 3 , plan  TDap at pharmacy, PNA , prevnar,  given flu shot today, had shingrix x 1 but lot of SE. DVE/pap: Not  indicated.. S/P TAH hysterectomy.      Hep C: Done    Problem List Items Addressed This Visit     Essential hypertension, benign   Stable, chronic.  Continue current medication.   HCTZ 25 mg p.o. daily      High cholesterol   Increased 10-year risk of cardiovascular disease.  Statin indicated.  She is not interested in starting a statin medication at this time despite discussion.   She will work on low-cholesterol diet, regular exercise and weight loss.       Osteopenia   Due for repeat DEXA      Relevant Orders   DG Bone Density   Other Visit Diagnoses       Routine general medical examination at a health care facility    -  Primary     Need for influenza vaccination       Relevant Orders   Flu vaccine HIGH DOSE PF(Fluzone Trivalent) (Completed)     History of squamous cell carcinoma of skin               Greig Ring, MD

## 2024-08-30 NOTE — Assessment & Plan Note (Signed)
Due for repeat DEXA. 

## 2024-08-30 NOTE — Assessment & Plan Note (Signed)
 Increased 10-year risk of cardiovascular disease.  Statin indicated.  She is not interested in starting a statin medication at this time despite discussion.   She will work on low-cholesterol diet, regular exercise and weight loss.

## 2024-08-30 NOTE — Patient Instructions (Signed)
 Call [facility] to set up bone density Address: [address] [place], [place] [address] Phone: [phone]    Call to set up Mammogram  [facility]                      Phone:  [phone] [address]                               [place], [place] [address]                                                             Services: Traditional and 3D Mammogram, Bone Density

## 2024-09-16 ENCOUNTER — Ambulatory Visit

## 2024-09-16 VITALS — Ht 60.75 in | Wt 184.0 lb

## 2024-09-16 DIAGNOSIS — Z0000 Encounter for general adult medical examination without abnormal findings: Secondary | ICD-10-CM | POA: Diagnosis not present

## 2024-09-16 NOTE — Progress Notes (Signed)
 Chief Complaint  Patient presents with   Medicare Wellness     Subjective:   Danielle Kane is a 78 y.o. female who presents for a Medicare Annual Wellness Visit.  Allergies (verified) Patient has no known allergies.   History: Past Medical History:  Diagnosis Date   Cataract Feb 2019   GERD (gastroesophageal reflux disease)    Past Surgical History:  Procedure Laterality Date   ABDOMINAL HYSTERECTOMY     Family History  Problem Relation Age of Onset   Heart failure Mother    Hearing loss Mother    Stroke Mother    Stroke Father    Kidney disease Maternal Grandmother    Coronary artery disease Maternal Grandfather    Heart attack Maternal Grandfather    Kidney disease Maternal Grandfather    Osteoporosis Paternal Grandfather    Social History   Occupational History   Occupation: production designer, theatre/television/film  Tobacco Use   Smoking status: Never   Smokeless tobacco: Never  Vaping Use   Vaping status: Never Used  Substance and Sexual Activity   Alcohol use: No    Alcohol/week: 0.0 standard drinks of alcohol   Drug use: No   Sexual activity: Not on file   Tobacco Counseling Counseling given: Not Answered  SDOH Screenings   Food Insecurity: No Food Insecurity (08/29/2024)  Housing: Low Risk  (09/16/2024)  Transportation Needs: No Transportation Needs (09/16/2024)  Utilities: Not At Risk (09/16/2024)  Alcohol Screen: Low Risk  (09/16/2024)  Depression (PHQ2-9): Low Risk  (09/16/2024)  Financial Resource Strain: Low Risk  (09/16/2024)  Physical Activity: Inactive (09/16/2024)  Social Connections: Moderately Integrated (09/16/2024)  Stress: No Stress Concern Present (09/16/2024)  Tobacco Use: Low Risk  (09/16/2024)  Health Literacy: Adequate Health Literacy (09/16/2024)   See flowsheets for full screening details  Depression Screen PHQ 2 & 9 Depression Scale- Over the past 2 weeks, how often have you been bothered by any of the following problems? Little interest or  pleasure in doing things: 0 Feeling down, depressed, or hopeless (PHQ Adolescent also includes...irritable): 0 PHQ-2 Total Score: 0 Trouble falling or staying asleep, or sleeping too much: 0 Feeling tired or having little energy: 0 Poor appetite or overeating (PHQ Adolescent also includes...weight loss): 0 Feeling bad about yourself - or that you are a failure or have let yourself or your family down: 0 Trouble concentrating on things, such as reading the newspaper or watching television (PHQ Adolescent also includes...like school work): 0 Moving or speaking so slowly that other people could have noticed. Or the opposite - being so fidgety or restless that you have been moving around a lot more than usual: 0 Thoughts that you would be better off dead, or of hurting yourself in some way: 0 PHQ-9 Total Score: 0 If you checked off any problems, how difficult have these problems made it for you to do your work, take care of things at home, or get along with other people?: Not difficult at all     Goals Addressed             This Visit's Progress    COMPLETED: Follow up with Primary Care Provider       Starring 02/02/2018, I will continue to take medications as prescribed and to keep appointments with PCP as scheduled.      I would like to retire       COMPLETED: Patient Stated       07/04/2020, I will maintain and continue medications as  prescribed.        Visit info / Clinical Intake: Medicare Wellness Visit Type:: Subsequent Annual Wellness Visit Persons participating in visit:: patient Medicare Wellness Visit Mode:: Telephone If telephone:: video declined Because this visit was a virtual/telehealth visit:: unable to obtan vitals due to lack of equipment If Telephone or Video please confirm:: I discussed the limitations of evaluation and management by telemedicine Patient Location:: home Provider Location:: clinic Information given by:: patient Interpreter Needed?: No Pre-visit  prep was completed: yes AWV questionnaire completed by patient prior to visit?: no Living arrangements:: (!) lives alone Patient's Overall Health Status Rating: very good Typical amount of pain: none Does pain affect daily life?: no Are you currently prescribed opioids?: no  Dietary Habits and Nutritional Risks How many meals a day?: 3 Eats fruit and vegetables daily?: yes Most meals are obtained by: preparing own meals In the last 2 weeks, have you had any of the following?: none Diabetic:: no  Functional Status Activities of Daily Living (to include ambulation/medication): Independent Ambulation: Independent Medication Administration: Independent Home Management: Independent Manage your own finances?: yes Primary transportation is: driving Concerns about vision?: no *vision screening is required for WTM* Concerns about hearing?: no  Fall Screening Falls in the past year?: 0 Number of falls in past year: 0 Was there an injury with Fall?: 0 Fall Risk Category Calculator: 0 Patient Fall Risk Level: Low Fall Risk  Fall Risk Patient at Risk for Falls Due to: No Fall Risks Fall risk Follow up: Education provided; Falls prevention discussed  Home and Transportation Safety: All rugs have non-skid backing?: N/A, no rugs All stairs or steps have railings?: yes Grab bars in the bathtub or shower?: (!) no Have non-skid surface in bathtub or shower?: yes Good home lighting?: yes Regular seat belt use?: yes Hospital stays in the last year:: no  Cognitive Assessment Difficulty concentrating, remembering, or making decisions? : no Will 6CIT or Mini Cog be Completed: yes What year is it?: 0 points What month is it?: 0 points Give patient an address phrase to remember (5 components): [address] [place]  About what time is it?: 0 points Count backwards from 20 to 1: 0 points Say the months of the year in reverse: 0 points Repeat the address phrase from earlier: 0  points 6 CIT Score: 0 points  Advance Directives (For Healthcare) Does Patient Have a Medical Advance Directive?: Yes Type of Advance Directive: Healthcare Power of [place]; Living will Copy of Healthcare Power of Attorney in Chart?: No - copy requested Copy of Living Will in Chart?: No - copy requested  Reviewed/Updated  Reviewed/Updated: Reviewed All (Medical, Surgical, Family, Medications, Allergies, Care Teams, Patient Goals)        Objective:    Today's Vitals   09/16/24 1305  Weight: 184 lb (83.5 kg)  Height: 5' 0.75 (1.543 m)   Body mass index is 35.05 kg/m.  Current Medications (verified) Outpatient Encounter Medications as of 09/16/2024  Medication Sig   acetaminophen  (TYLENOL ) 500 MG tablet Take 500 mg by mouth every 6 (six) hours as needed.   Calcium Carbonate-Vitamin D  600-400 MG-UNIT tablet Take 1 tablet by mouth daily.     esomeprazole  (NEXIUM ) 20 MG capsule Take 20 mg by mouth daily at 12 noon.   hydrochlorothiazide  (HYDRODIURIL ) 25 MG tablet TAKE 1 TABLET (25 MG TOTAL) BY MOUTH DAILY.   ibuprofen  (ADVIL ,MOTRIN ) 200 MG tablet Take 400 mg by mouth every 6 (six) hours as needed for moderate pain.  Multiple Vitamin (MULTIVITAMIN) tablet Take 1 tablet by mouth daily.     Simethicone 180 MG CAPS Take 180 mg by mouth daily as needed (for bloating).   No facility-administered encounter medications on file as of 09/16/2024.   Hearing/Vision screen Vision Screening - Comments:: UTD w/visits to [facility] Immunizations and Health Maintenance Health Maintenance  Topic Date Due   Mammogram  02/20/2017   Zoster Vaccines- Shingrix (2 of 2) 10/25/2020   DEXA SCAN  02/20/2021   DTaP/Tdap/Td (3 - Td or Tdap) 11/16/2021   Medicare Annual Wellness (AWV)  07/12/2023   COVID-19 Vaccine (4 - 2025-26 season) 07/04/2024   Pneumococcal Vaccine: 50+ Years  Completed   Influenza Vaccine  Completed   Hepatitis C Screening  Completed   Meningococcal B Vaccine   Aged Out   Fecal DNA (Cologuard)  Discontinued        Assessment/Plan:  This is a routine wellness examination for Jaynie.  Patient Care Team: Avelina Greig BRAVO, MD as PCP - General  I have personally reviewed and noted the following in the patient's chart:   Medical and social history Use of alcohol, tobacco or illicit drugs  Current medications and supplements including opioid prescriptions. Functional ability and status Nutritional status Physical activity Advanced directives List of other physicians Hospitalizations, surgeries, and ER visits in previous 12 months Vitals Screenings to include cognitive, depression, and falls Referrals and appointments  No orders of the defined types were placed in this encounter.  In addition, I have reviewed and discussed with patient certain preventive protocols, quality metrics, and best practice recommendations. A written personalized care plan for preventive services as well as general preventive health recommendations were provided to patient.   Erminio LITTIE Saris, LPN   [id number]    After Visit Summary: (MyChart) Due to this being a telephonic visit, the after visit summary with patients personalized plan was offered to patient via MyChart   Nurse Notes: Pt has no concerns or questions. AWV made for one year to align with CPE

## 2024-09-16 NOTE — Patient Instructions (Signed)
 Danielle Kane,  Thank you for taking the time for your Medicare Wellness Visit. I appreciate your continued commitment to your health goals. Please review the care plan we discussed, and feel free to reach out if I can assist you further.  Please note that Annual Wellness Visits do not include a physical exam. Some assessments may be limited, especially if the visit was conducted virtually. If needed, we may recommend an in-person follow-up with your provider.  Ongoing Care Seeing your primary care provider every 3 to 6 months helps us  monitor your health and provide consistent, personalized care.   Referrals If a referral was made during today's visit and you haven't received any updates within two weeks, please contact the referred provider directly to check on the status.  Recommended Screenings:  Health Maintenance  Topic Date Due   Breast Cancer Screening  02/20/2017   Zoster (Shingles) Vaccine (2 of 2) 10/25/2020   DEXA scan (bone density measurement)  02/20/2021   DTaP/Tdap/Td vaccine (3 - Td or Tdap) 11/16/2021   Medicare Annual Wellness Visit  07/12/2023   COVID-19 Vaccine (4 - 2025-26 season) 07/04/2024   Pneumococcal Vaccine for age over 61  Completed   Flu Shot  Completed   Hepatitis C Screening  Completed   Meningitis B Vaccine  Aged Out   Cologuard (Stool DNA test)  Discontinued       09/16/2024    1:10 PM  Advanced Directives  Does Patient Have a Medical Advance Directive? Yes  Type of Estate Agent of [place];Living will  Copy of Healthcare Power of Attorney in Chart? No - copy requested    Vision: Annual vision screenings are recommended for early detection of glaucoma, cataracts, and diabetic retinopathy. These exams can also reveal signs of chronic conditions such as diabetes and high blood pressure.  Dental: Annual dental screenings help detect early signs of oral cancer, gum disease, and other conditions linked to overall health, including  heart disease and diabetes.  Please see the attached documents for additional preventive care recommendations.

## 2024-11-11 ENCOUNTER — Other Ambulatory Visit: Payer: Self-pay | Admitting: Family Medicine

## 2025-08-24 ENCOUNTER — Other Ambulatory Visit

## 2025-08-31 ENCOUNTER — Encounter: Admitting: Family Medicine

## 2025-09-19 ENCOUNTER — Encounter: Admitting: Family Medicine

## 2025-09-19 ENCOUNTER — Ambulatory Visit
